# Patient Record
Sex: Female | Born: 1950
Health system: Southern US, Community
[De-identification: ages and names within clinical notes are randomized; demographics above are authoritative.]

## PROBLEM LIST (undated history)

## (undated) DIAGNOSIS — E785 Hyperlipidemia, unspecified: Secondary | ICD-10-CM

## (undated) DIAGNOSIS — K635 Polyp of colon: Secondary | ICD-10-CM

## (undated) DIAGNOSIS — E041 Nontoxic single thyroid nodule: Secondary | ICD-10-CM

## (undated) DIAGNOSIS — N852 Hypertrophy of uterus: Secondary | ICD-10-CM

## (undated) DIAGNOSIS — M8580 Other specified disorders of bone density and structure, unspecified site: Secondary | ICD-10-CM

## (undated) HISTORY — DX: Other specified disorders of bone density and structure, unspecified site: M85.80

## (undated) HISTORY — DX: Polyp of colon: K63.5

## (undated) HISTORY — DX: Hyperlipidemia, unspecified: E78.5

## (undated) HISTORY — DX: Nontoxic single thyroid nodule: E04.1

## (undated) HISTORY — PX: LAPAROSCOPY: SHX197

## (undated) HISTORY — DX: Hypertrophy of uterus: N85.2

---

## 1999-09-10 ENCOUNTER — Other Ambulatory Visit: Admission: RE | Admit: 1999-09-10 | Discharge: 1999-09-10 | Payer: Self-pay | Admitting: Obstetrics and Gynecology

## 2000-02-19 ENCOUNTER — Encounter: Payer: Self-pay | Admitting: Emergency Medicine

## 2000-02-19 ENCOUNTER — Emergency Department (HOSPITAL_COMMUNITY): Admission: EM | Admit: 2000-02-19 | Discharge: 2000-02-19 | Payer: Self-pay

## 2000-09-10 ENCOUNTER — Emergency Department (HOSPITAL_COMMUNITY): Admission: EM | Admit: 2000-09-10 | Discharge: 2000-09-10 | Payer: Self-pay | Admitting: Emergency Medicine

## 2000-09-10 ENCOUNTER — Encounter: Payer: Self-pay | Admitting: Emergency Medicine

## 2000-09-24 ENCOUNTER — Other Ambulatory Visit: Admission: RE | Admit: 2000-09-24 | Discharge: 2000-09-24 | Payer: Self-pay | Admitting: Family Medicine

## 2000-12-25 ENCOUNTER — Other Ambulatory Visit: Admission: RE | Admit: 2000-12-25 | Discharge: 2000-12-25 | Payer: Self-pay | Admitting: Gastroenterology

## 2000-12-25 ENCOUNTER — Encounter (INDEPENDENT_AMBULATORY_CARE_PROVIDER_SITE_OTHER): Payer: Self-pay | Admitting: Specialist

## 2002-01-05 ENCOUNTER — Other Ambulatory Visit: Admission: RE | Admit: 2002-01-05 | Discharge: 2002-01-05 | Payer: Self-pay | Admitting: Family Medicine

## 2002-09-01 ENCOUNTER — Encounter: Admission: RE | Admit: 2002-09-01 | Discharge: 2002-09-01 | Payer: Self-pay | Admitting: Family Medicine

## 2002-09-01 ENCOUNTER — Encounter: Payer: Self-pay | Admitting: Family Medicine

## 2002-12-28 ENCOUNTER — Other Ambulatory Visit: Admission: RE | Admit: 2002-12-28 | Discharge: 2002-12-28 | Payer: Self-pay | Admitting: Family Medicine

## 2002-12-29 ENCOUNTER — Encounter: Admission: RE | Admit: 2002-12-29 | Discharge: 2002-12-29 | Payer: Self-pay | Admitting: Family Medicine

## 2002-12-29 ENCOUNTER — Encounter: Payer: Self-pay | Admitting: Family Medicine

## 2002-12-30 ENCOUNTER — Encounter: Admission: RE | Admit: 2002-12-30 | Discharge: 2003-02-02 | Payer: Self-pay | Admitting: Family Medicine

## 2003-02-04 ENCOUNTER — Encounter: Admission: RE | Admit: 2003-02-04 | Discharge: 2003-03-09 | Payer: Self-pay | Admitting: Family Medicine

## 2003-12-28 ENCOUNTER — Other Ambulatory Visit: Admission: RE | Admit: 2003-12-28 | Discharge: 2003-12-28 | Payer: Self-pay | Admitting: Family Medicine

## 2005-01-21 ENCOUNTER — Ambulatory Visit: Payer: Self-pay | Admitting: Family Medicine

## 2005-01-21 ENCOUNTER — Other Ambulatory Visit: Admission: RE | Admit: 2005-01-21 | Discharge: 2005-01-21 | Payer: Self-pay | Admitting: Family Medicine

## 2005-02-21 ENCOUNTER — Ambulatory Visit: Payer: Self-pay | Admitting: Family Medicine

## 2005-06-17 HISTORY — PX: COLONOSCOPY: SHX174

## 2006-02-11 ENCOUNTER — Ambulatory Visit: Payer: Self-pay | Admitting: Gastroenterology

## 2006-02-24 ENCOUNTER — Ambulatory Visit: Payer: Self-pay | Admitting: Gastroenterology

## 2006-04-03 ENCOUNTER — Other Ambulatory Visit: Admission: RE | Admit: 2006-04-03 | Discharge: 2006-04-03 | Payer: Self-pay | Admitting: Family Medicine

## 2006-04-03 ENCOUNTER — Ambulatory Visit: Payer: Self-pay | Admitting: Family Medicine

## 2006-04-03 ENCOUNTER — Encounter: Payer: Self-pay | Admitting: Family Medicine

## 2006-04-11 ENCOUNTER — Ambulatory Visit: Payer: Self-pay | Admitting: Family Medicine

## 2006-04-11 LAB — CONVERTED CEMR LAB
AST: 19 units/L (ref 0–37)
BUN: 9 mg/dL (ref 6–23)
Chloride: 104 meq/L (ref 96–112)
Cholesterol: 203 mg/dL (ref 0–200)
Creatinine, Ser: 0.8 mg/dL (ref 0.4–1.2)
Eosinophil percent: 2.8 % (ref 0.0–5.0)
HCT: 40.6 % (ref 36.0–46.0)
HDL: 52.5 mg/dL (ref >39.0)
Hemoglobin: 13.5 g/dL (ref 12.0–15.0)
LDL DIRECT: 127.7 mg/dL
MCHC: 33.2 g/dL (ref 30.0–36.0)
MCV: 92.9 fL (ref 78.0–100.0)
Monocytes Absolute: 0.4 10*3/uL (ref 0.2–0.7)
Neutro Abs: 2.8 10*3/uL (ref 1.4–7.7)
Neutrophils Relative %: 61.1 % (ref 43.0–77.0)
RDW: 13.1 % (ref 11.5–14.6)

## 2006-05-13 ENCOUNTER — Ambulatory Visit: Payer: Self-pay | Admitting: Family Medicine

## 2006-12-17 ENCOUNTER — Ambulatory Visit: Payer: Self-pay | Admitting: Family Medicine

## 2006-12-17 DIAGNOSIS — M79609 Pain in unspecified limb: Secondary | ICD-10-CM

## 2006-12-22 ENCOUNTER — Encounter (INDEPENDENT_AMBULATORY_CARE_PROVIDER_SITE_OTHER): Payer: Self-pay | Admitting: *Deleted

## 2007-01-19 ENCOUNTER — Telehealth (INDEPENDENT_AMBULATORY_CARE_PROVIDER_SITE_OTHER): Payer: Self-pay | Admitting: *Deleted

## 2007-01-22 ENCOUNTER — Ambulatory Visit: Payer: Self-pay | Admitting: Family Medicine

## 2007-01-22 DIAGNOSIS — L299 Pruritus, unspecified: Secondary | ICD-10-CM | POA: Insufficient documentation

## 2007-01-23 LAB — CONVERTED CEMR LAB
ALT: 18 units/L (ref 0–35)
AST: 20 units/L (ref 0–37)
Albumin: 3.8 g/dL (ref 3.5–5.2)
Chloride: 106 meq/L (ref 96–112)
Creatinine, Ser: 0.7 mg/dL (ref 0.4–1.2)
Sodium: 139 meq/L (ref 135–145)
Total Bilirubin: 0.8 mg/dL (ref 0.3–1.2)

## 2007-02-17 ENCOUNTER — Ambulatory Visit: Payer: Self-pay | Admitting: Family Medicine

## 2007-02-17 DIAGNOSIS — R599 Enlarged lymph nodes, unspecified: Secondary | ICD-10-CM | POA: Insufficient documentation

## 2007-02-18 ENCOUNTER — Encounter: Admission: RE | Admit: 2007-02-18 | Discharge: 2007-02-18 | Payer: Self-pay | Admitting: Family Medicine

## 2007-02-18 LAB — CONVERTED CEMR LAB
Basophils Absolute: 0 10*3/uL (ref 0.0–0.1)
Eosinophils Absolute: 0.1 10*3/uL (ref 0.0–0.6)
Eosinophils Relative: 2.8 % (ref 0.0–5.0)
MCHC: 34.7 g/dL (ref 30.0–36.0)
MCV: 86.7 fL (ref 78.0–100.0)
Monocytes Relative: 2.4 % — ABNORMAL LOW (ref 3.0–11.0)
Platelets: 162 10*3/uL (ref 150–400)
RBC: 4.06 M/uL (ref 3.87–5.11)
WBC: 5 10*3/uL (ref 4.5–10.5)

## 2007-02-20 DIAGNOSIS — E041 Nontoxic single thyroid nodule: Secondary | ICD-10-CM | POA: Insufficient documentation

## 2007-02-23 ENCOUNTER — Ambulatory Visit: Payer: Self-pay | Admitting: Family Medicine

## 2007-02-25 ENCOUNTER — Encounter: Payer: Self-pay | Admitting: Family Medicine

## 2007-02-25 LAB — CONVERTED CEMR LAB
Free T4: 0.7 ng/dL (ref 0.6–1.6)
T3, Free: 2.7 pg/mL (ref 2.3–4.2)
TSH: 3.32 microintl units/mL (ref 0.35–5.50)

## 2007-03-30 ENCOUNTER — Encounter: Payer: Self-pay | Admitting: Family Medicine

## 2007-04-09 ENCOUNTER — Encounter (INDEPENDENT_AMBULATORY_CARE_PROVIDER_SITE_OTHER): Payer: Self-pay | Admitting: Interventional Radiology

## 2007-04-09 ENCOUNTER — Encounter: Payer: Self-pay | Admitting: Family Medicine

## 2007-04-09 ENCOUNTER — Other Ambulatory Visit: Admission: RE | Admit: 2007-04-09 | Discharge: 2007-04-09 | Payer: Self-pay | Admitting: Interventional Radiology

## 2007-04-09 ENCOUNTER — Encounter: Admission: RE | Admit: 2007-04-09 | Discharge: 2007-04-09 | Payer: Self-pay | Admitting: Otolaryngology

## 2007-04-17 ENCOUNTER — Telehealth: Payer: Self-pay | Admitting: Family Medicine

## 2007-04-17 DIAGNOSIS — N951 Menopausal and female climacteric states: Secondary | ICD-10-CM | POA: Insufficient documentation

## 2007-04-29 ENCOUNTER — Encounter: Payer: Self-pay | Admitting: Family Medicine

## 2007-05-07 ENCOUNTER — Other Ambulatory Visit: Admission: RE | Admit: 2007-05-07 | Discharge: 2007-05-07 | Payer: Self-pay | Admitting: Family Medicine

## 2007-05-07 ENCOUNTER — Telehealth (INDEPENDENT_AMBULATORY_CARE_PROVIDER_SITE_OTHER): Payer: Self-pay | Admitting: *Deleted

## 2007-05-07 ENCOUNTER — Ambulatory Visit: Payer: Self-pay | Admitting: Family Medicine

## 2007-05-07 ENCOUNTER — Encounter: Payer: Self-pay | Admitting: Family Medicine

## 2007-05-11 ENCOUNTER — Encounter: Payer: Self-pay | Admitting: Family Medicine

## 2007-05-13 ENCOUNTER — Encounter (INDEPENDENT_AMBULATORY_CARE_PROVIDER_SITE_OTHER): Payer: Self-pay | Admitting: *Deleted

## 2007-05-18 ENCOUNTER — Ambulatory Visit: Payer: Self-pay | Admitting: Family Medicine

## 2007-05-18 LAB — CONVERTED CEMR LAB
Nitrite: NEGATIVE
Specific Gravity, Urine: <1.005
Urobilinogen, UA: NEGATIVE
WBC Urine, dipstick: NEGATIVE

## 2007-05-21 DIAGNOSIS — M899 Disorder of bone, unspecified: Secondary | ICD-10-CM | POA: Insufficient documentation

## 2007-05-21 DIAGNOSIS — M949 Disorder of cartilage, unspecified: Secondary | ICD-10-CM

## 2007-05-25 LAB — CONVERTED CEMR LAB
ALT: 19 units/L (ref 0–35)
AST: 19 units/L (ref 0–37)
Basophils Relative: 0.5 % (ref 0.0–1.0)
Bilirubin, Direct: 0.1 mg/dL (ref 0.0–0.3)
CO2: 32 meq/L (ref 19–32)
Calcium: 9.4 mg/dL (ref 8.4–10.5)
Chloride: 108 meq/L (ref 96–112)
Direct LDL: 137.9 mg/dL
Eosinophils Absolute: 0.2 10*3/uL (ref 0.0–0.6)
Eosinophils Relative: 3.6 % (ref 0.0–5.0)
GFR calc non Af Amer: 93 mL/min
Glucose, Bld: 87 mg/dL (ref 70–99)
HCT: 37 % (ref 36.0–46.0)
Lymphocytes Relative: 33 % (ref 12.0–46.0)
MCV: 87 fL (ref 78.0–100.0)
Neutro Abs: 2.3 10*3/uL (ref 1.4–7.7)
Neutrophils Relative %: 56 % (ref 43.0–77.0)
RBC: 4.25 M/uL (ref 3.87–5.11)
Sodium: 144 meq/L (ref 135–145)
Total Protein: 6.7 g/dL (ref 6.0–8.3)
VLDL: 11 mg/dL (ref 0–40)
WBC: 4.2 10*3/uL — ABNORMAL LOW (ref 4.5–10.5)

## 2007-05-26 ENCOUNTER — Ambulatory Visit: Payer: Self-pay | Admitting: Family Medicine

## 2007-05-26 ENCOUNTER — Encounter (INDEPENDENT_AMBULATORY_CARE_PROVIDER_SITE_OTHER): Payer: Self-pay | Admitting: *Deleted

## 2007-08-31 ENCOUNTER — Ambulatory Visit: Payer: Self-pay | Admitting: Family Medicine

## 2007-09-10 LAB — CONVERTED CEMR LAB
ALT: 20 units/L (ref 0–35)
AST: 22 units/L (ref 0–37)
Albumin: 4 g/dL (ref 3.5–5.2)
Alkaline Phosphatase: 72 units/L (ref 39–117)
HDL: 70.1 mg/dL (ref >39.0)
Total Bilirubin: 0.8 mg/dL (ref 0.3–1.2)
VLDL: 15 mg/dL (ref 0–40)

## 2007-09-11 ENCOUNTER — Encounter (INDEPENDENT_AMBULATORY_CARE_PROVIDER_SITE_OTHER): Payer: Self-pay | Admitting: *Deleted

## 2008-01-15 ENCOUNTER — Telehealth (INDEPENDENT_AMBULATORY_CARE_PROVIDER_SITE_OTHER): Payer: Self-pay | Admitting: *Deleted

## 2008-01-15 ENCOUNTER — Ambulatory Visit: Payer: Self-pay | Admitting: Family Medicine

## 2008-01-15 DIAGNOSIS — M545 Low back pain, unspecified: Secondary | ICD-10-CM | POA: Insufficient documentation

## 2008-01-17 ENCOUNTER — Telehealth (INDEPENDENT_AMBULATORY_CARE_PROVIDER_SITE_OTHER): Payer: Self-pay | Admitting: *Deleted

## 2008-08-16 ENCOUNTER — Encounter: Payer: Self-pay | Admitting: Family Medicine

## 2008-08-16 ENCOUNTER — Other Ambulatory Visit: Admission: RE | Admit: 2008-08-16 | Discharge: 2008-08-16 | Payer: Self-pay | Admitting: Family Medicine

## 2008-08-16 ENCOUNTER — Ambulatory Visit: Payer: Self-pay | Admitting: Family Medicine

## 2008-08-16 DIAGNOSIS — D239 Other benign neoplasm of skin, unspecified: Secondary | ICD-10-CM | POA: Insufficient documentation

## 2008-08-17 ENCOUNTER — Encounter: Payer: Self-pay | Admitting: Family Medicine

## 2008-08-18 ENCOUNTER — Ambulatory Visit: Payer: Self-pay | Admitting: Family Medicine

## 2008-08-18 LAB — CONVERTED CEMR LAB
Blood in Urine, dipstick: NEGATIVE
Ketones, urine, test strip: NEGATIVE
Nitrite: NEGATIVE
Protein, U semiquant: NEGATIVE
Specific Gravity, Urine: <1.005
Urobilinogen, UA: 0.2
WBC Urine, dipstick: NEGATIVE

## 2008-08-22 ENCOUNTER — Encounter (INDEPENDENT_AMBULATORY_CARE_PROVIDER_SITE_OTHER): Payer: Self-pay | Admitting: *Deleted

## 2008-09-05 ENCOUNTER — Encounter (INDEPENDENT_AMBULATORY_CARE_PROVIDER_SITE_OTHER): Payer: Self-pay | Admitting: *Deleted

## 2008-09-05 ENCOUNTER — Ambulatory Visit: Payer: Self-pay | Admitting: Family Medicine

## 2008-09-07 ENCOUNTER — Encounter: Payer: Self-pay | Admitting: Family Medicine

## 2008-09-08 ENCOUNTER — Encounter (INDEPENDENT_AMBULATORY_CARE_PROVIDER_SITE_OTHER): Payer: Self-pay | Admitting: *Deleted

## 2008-09-08 LAB — CONVERTED CEMR LAB
ALT: 24 units/L (ref 0–35)
AST: 27 units/L (ref 0–37)
Albumin: 3.8 g/dL (ref 3.5–5.2)
Alkaline Phosphatase: 71 units/L (ref 39–117)
BUN: 13 mg/dL (ref 6–23)
Basophils Absolute: 0 10*3/uL (ref 0.0–0.1)
Basophils Relative: 0.5 % (ref 0.0–3.0)
Bilirubin, Direct: 0.1 mg/dL (ref 0.0–0.3)
CO2: 30 meq/L (ref 19–32)
Calcium: 9.6 mg/dL (ref 8.4–10.5)
Chloride: 106 meq/L (ref 96–112)
Cholesterol: 231 mg/dL (ref 0–200)
Creatinine, Ser: 0.7 mg/dL (ref 0.4–1.2)
Direct LDL: 158.1 mg/dL
Eosinophils Absolute: 0.1 10*3/uL (ref 0.0–0.7)
Eosinophils Relative: 3.2 % (ref 0.0–5.0)
GFR calc Af Amer: 111 mL/min
GFR calc non Af Amer: 92 mL/min
Glucose, Bld: 85 mg/dL (ref 70–99)
HCT: 40.3 % (ref 36.0–46.0)
HDL: 58.1 mg/dL (ref >39.0)
Hemoglobin: 13.9 g/dL (ref 12.0–15.0)
Lymphocytes Relative: 30 % (ref 12.0–46.0)
MCHC: 34.5 g/dL (ref 30.0–36.0)
MCV: 91.9 fL (ref 78.0–100.0)
Monocytes Absolute: 0.3 10*3/uL (ref 0.1–1.0)
Monocytes Relative: 7.4 % (ref 3.0–12.0)
Neutro Abs: 2.6 10*3/uL (ref 1.4–7.7)
Neutrophils Relative %: 58.9 % (ref 43.0–77.0)
Platelets: 139 10*3/uL — ABNORMAL LOW (ref 150–400)
Potassium: 4.8 meq/L (ref 3.5–5.1)
RBC: 4.39 M/uL (ref 3.87–5.11)
RDW: 12.7 % (ref 11.5–14.6)
Sodium: 142 meq/L (ref 135–145)
TSH: 3.63 microintl units/mL (ref 0.35–5.50)
Total Bilirubin: 0.9 mg/dL (ref 0.3–1.2)
Total CHOL/HDL Ratio: 4
Total Protein: 6.7 g/dL (ref 6.0–8.3)
Triglycerides: 81 mg/dL (ref 0–149)
VLDL: 16 mg/dL (ref 0–40)
WBC: 4.3 10*3/uL — ABNORMAL LOW (ref 4.5–10.5)

## 2008-09-14 ENCOUNTER — Telehealth (INDEPENDENT_AMBULATORY_CARE_PROVIDER_SITE_OTHER): Payer: Self-pay | Admitting: *Deleted

## 2008-09-19 ENCOUNTER — Encounter: Payer: Self-pay | Admitting: Family Medicine

## 2008-09-20 ENCOUNTER — Encounter: Payer: Self-pay | Admitting: Family Medicine

## 2008-10-14 ENCOUNTER — Telehealth (INDEPENDENT_AMBULATORY_CARE_PROVIDER_SITE_OTHER): Payer: Self-pay | Admitting: *Deleted

## 2008-10-28 IMAGING — US US SOFT TISSUE HEAD/NECK
1 series · 14 of 25 positions shown · non-contrast
Comparison: None.

CLINICAL DATA: Left sided lymph node enlargement and itching.     
 THYROID ULTRASOUND:
TECHNIQUE: Ultrasound examination of the thyroid gland and adjacent soft tissue structures was performed.

[Series 1: us soft tissue head/neck · 0.07mm/px · 14 of 78 slices shown]
[im 1/78]
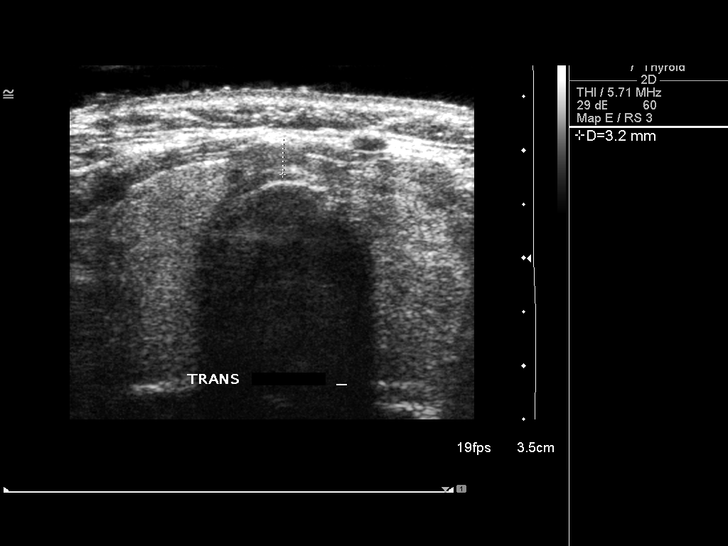
[im 7/78]
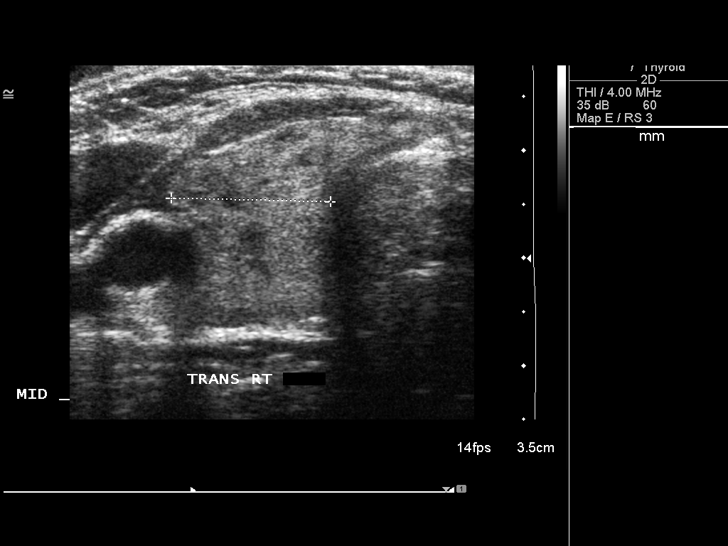
[im 13/78]
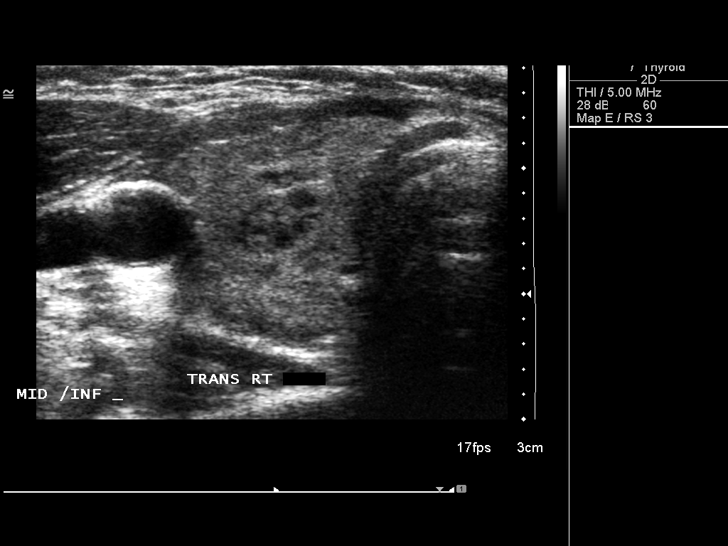
[im 20/78]
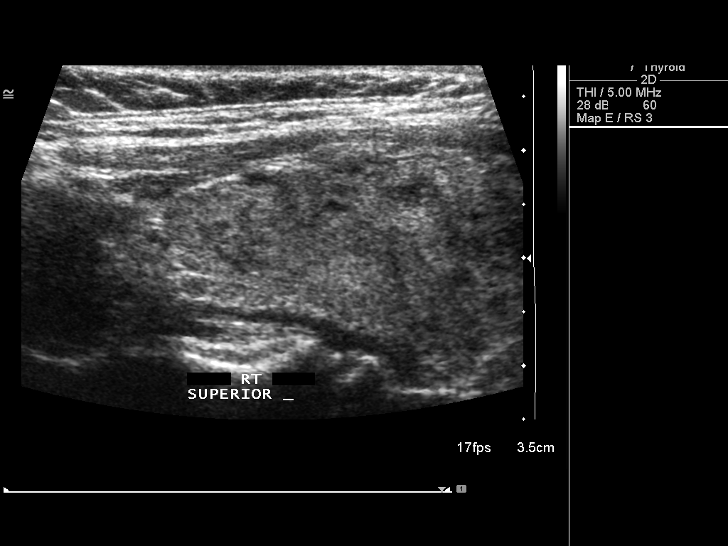
[im 26/78]
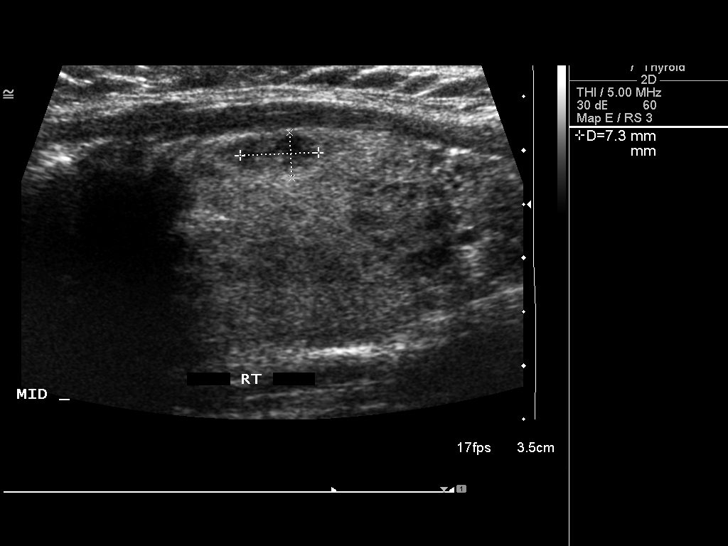
[im 29/78]
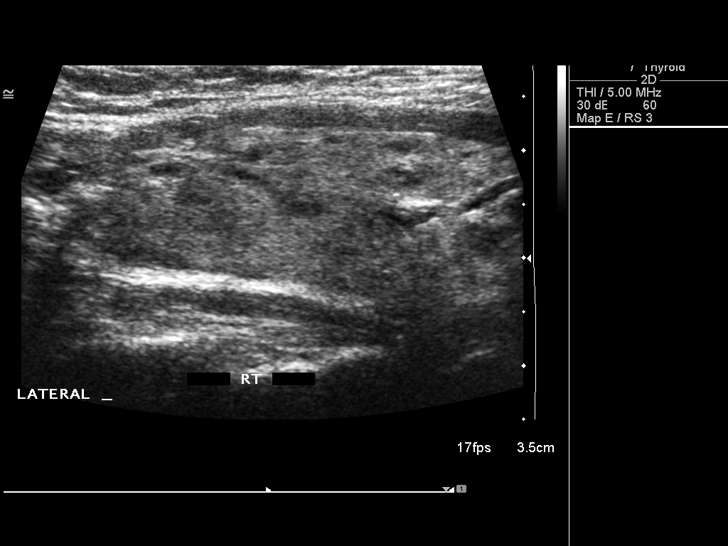
[im 36/78]
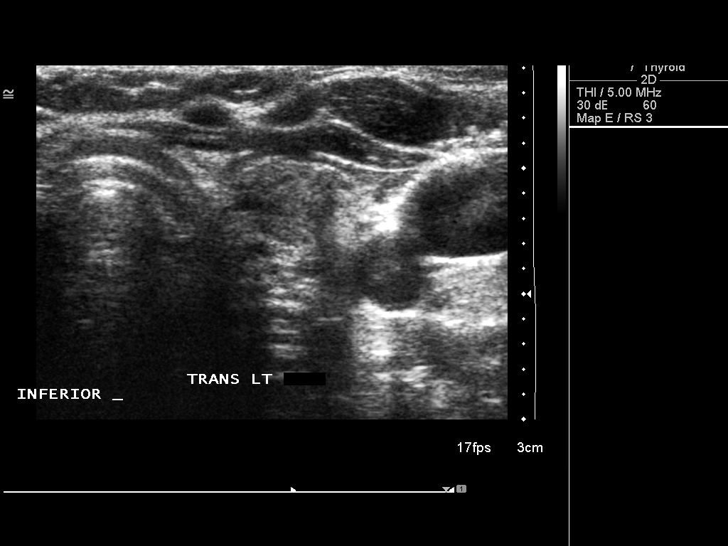
[im 42/78]
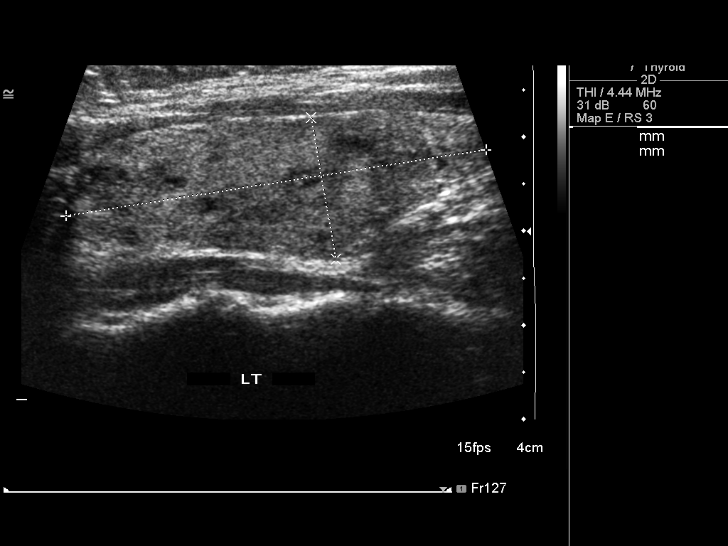
[im 49/78]
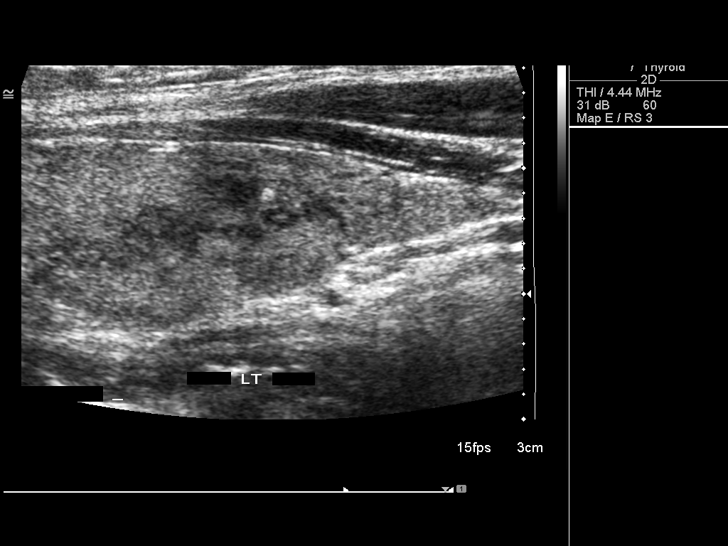
[im 52/78]
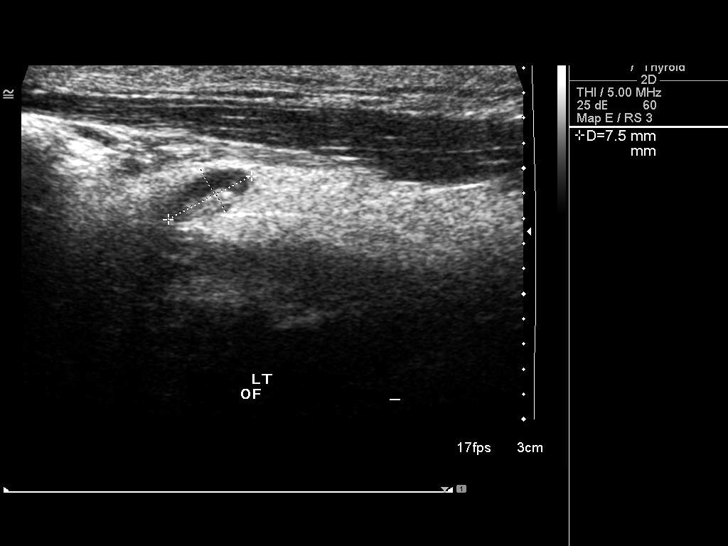
[im 58/78]
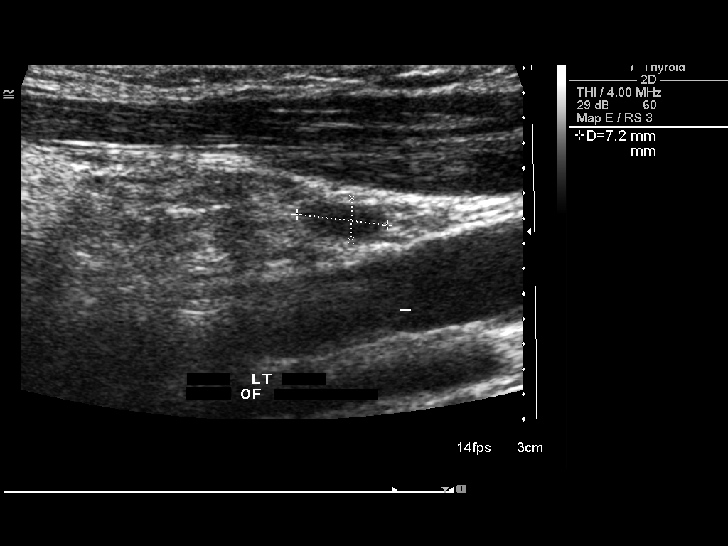
[im 65/78]
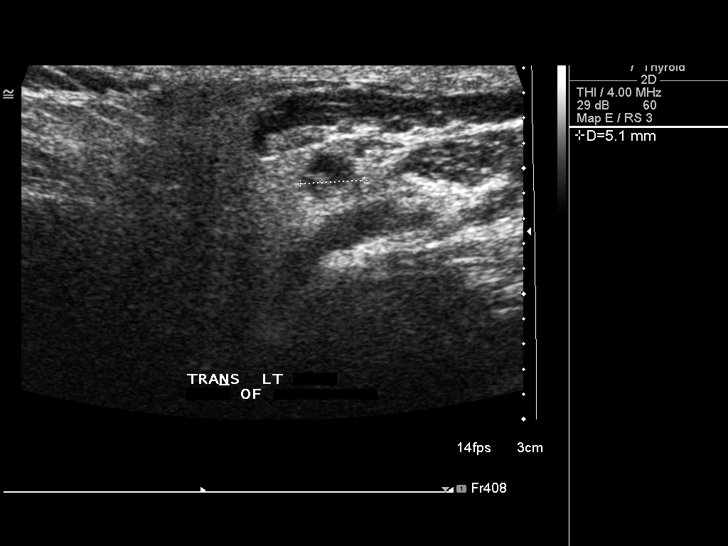
[im 71/78]
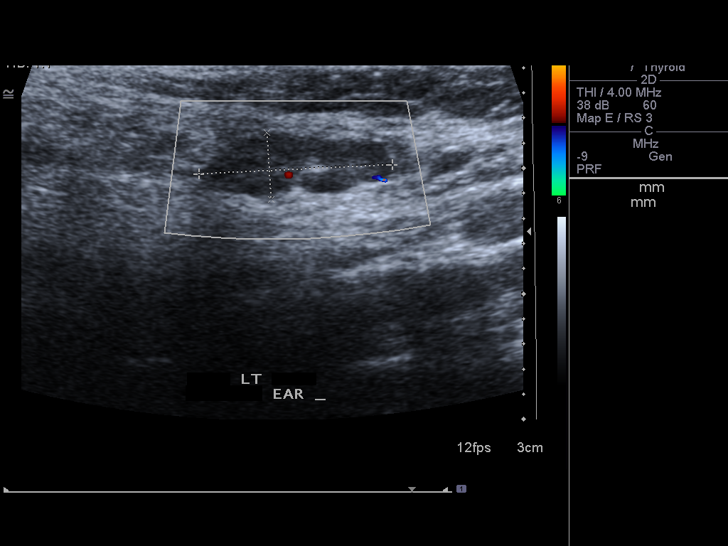
[im 78/78]
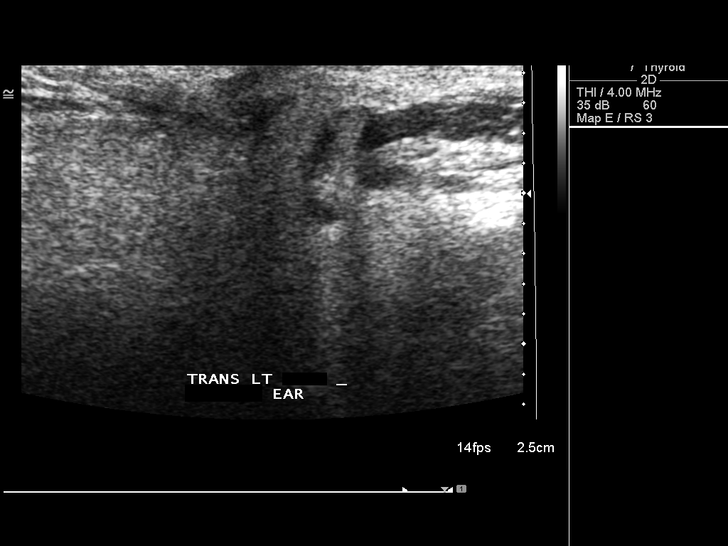

[14 of 25 positions shown; findings below may reference images not displayed]

FINDINGS: The right lobe of the thyroid measures 4.9 x 1.9 x 1.5 cm.  Left lobe measures 4.7 x 1.6 x 1.5 cm.  Isthmus measures 3 mm.  Thyroid echo texture is heterogeneous bilaterally.  There are small nodules in the thyroid bilaterally, with the largest measuring 10 x 8 x 9 mm in the lower pole on the right.  Lymph nodes are seen around the left sternocleidomastoid muscle with the largest measuring 15 x 5 x 6 mm.
IMPRESSION: 1.  Small thyroid nodules, as above.
 2.  Lymph nodes along the left sternocleidomastoid muscle.

## 2009-03-10 ENCOUNTER — Encounter: Payer: Self-pay | Admitting: Family Medicine

## 2009-04-11 ENCOUNTER — Encounter: Payer: Self-pay | Admitting: Family Medicine

## 2009-09-12 ENCOUNTER — Encounter: Payer: Self-pay | Admitting: Family Medicine

## 2009-09-13 ENCOUNTER — Encounter: Payer: Self-pay | Admitting: Family Medicine

## 2009-09-19 ENCOUNTER — Encounter: Payer: Self-pay | Admitting: Family Medicine

## 2009-09-24 IMAGING — CR DG SACRUM/COCCYX 2+V
3 series · 3 of 3 positions shown · non-contrast
Comparison: None available.

CLINICAL DATA: Low back and left buttock pain.

SACRUM AND COCCYX - 2+ VIEW

[view not recorded (1 of 3)]
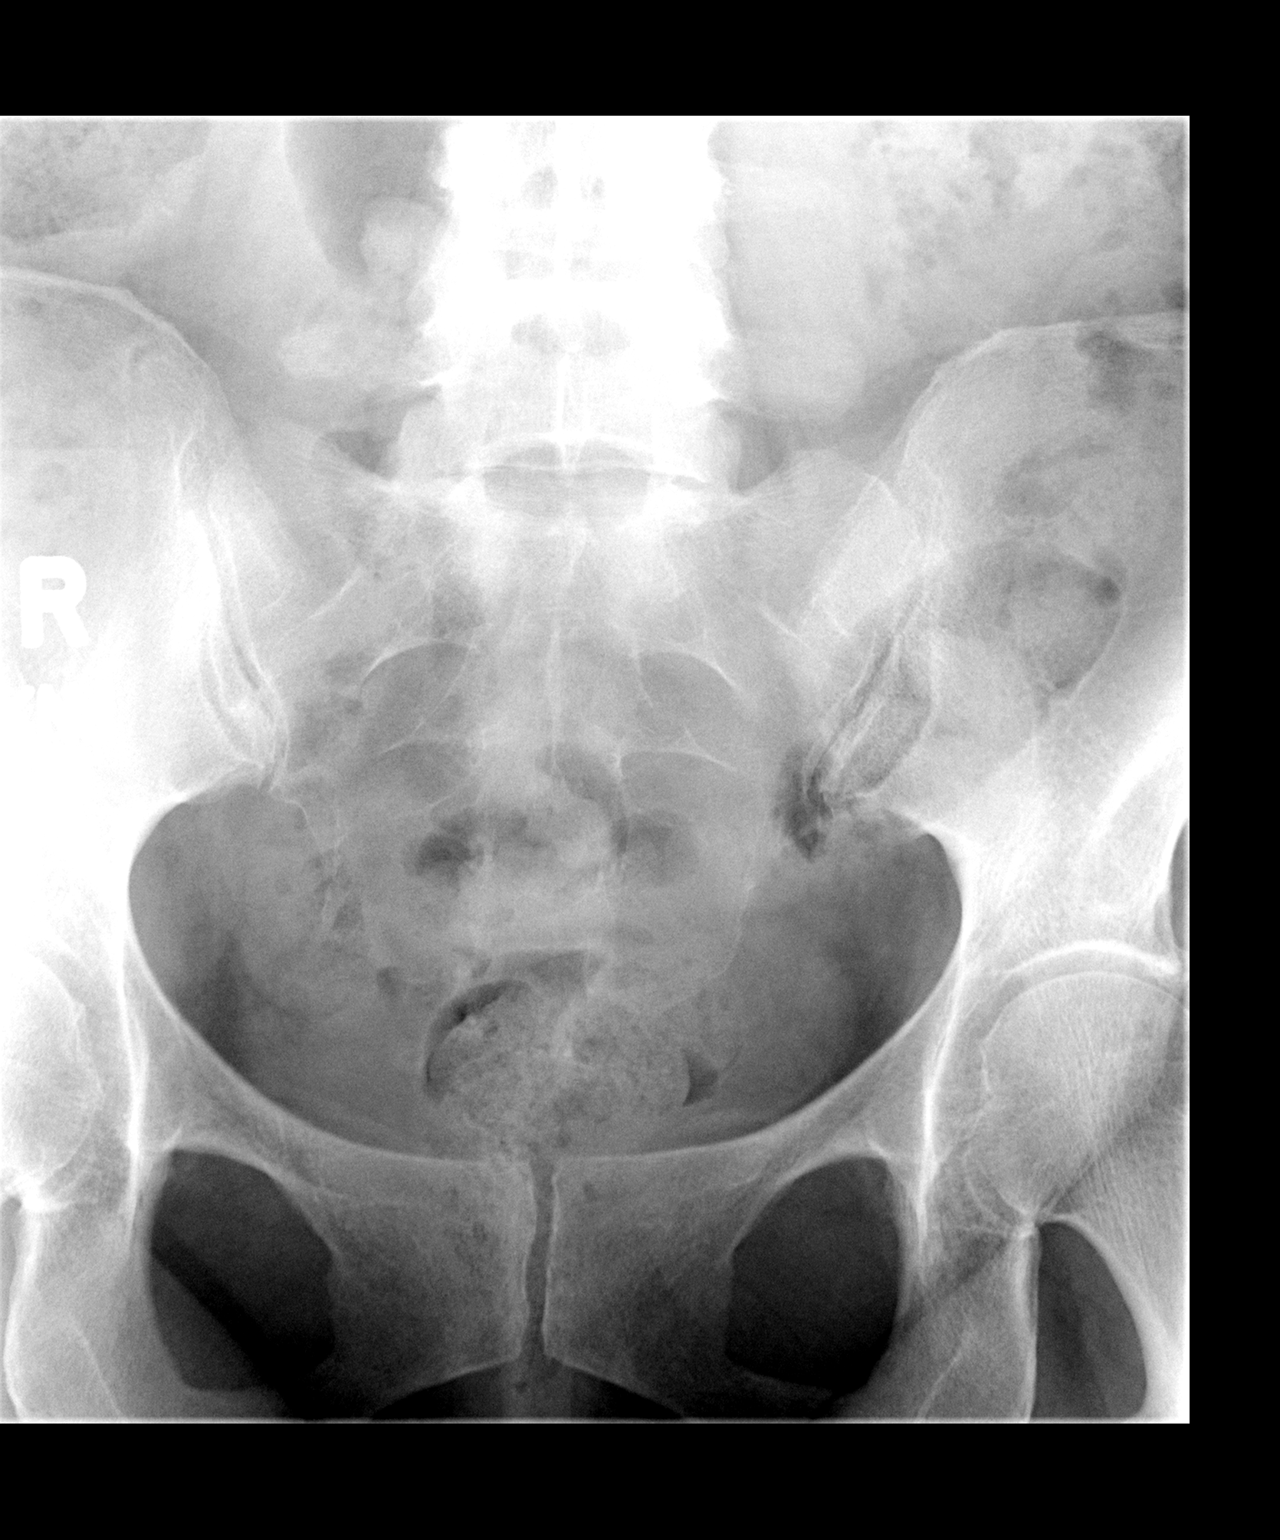

[view not recorded (2 of 3)]
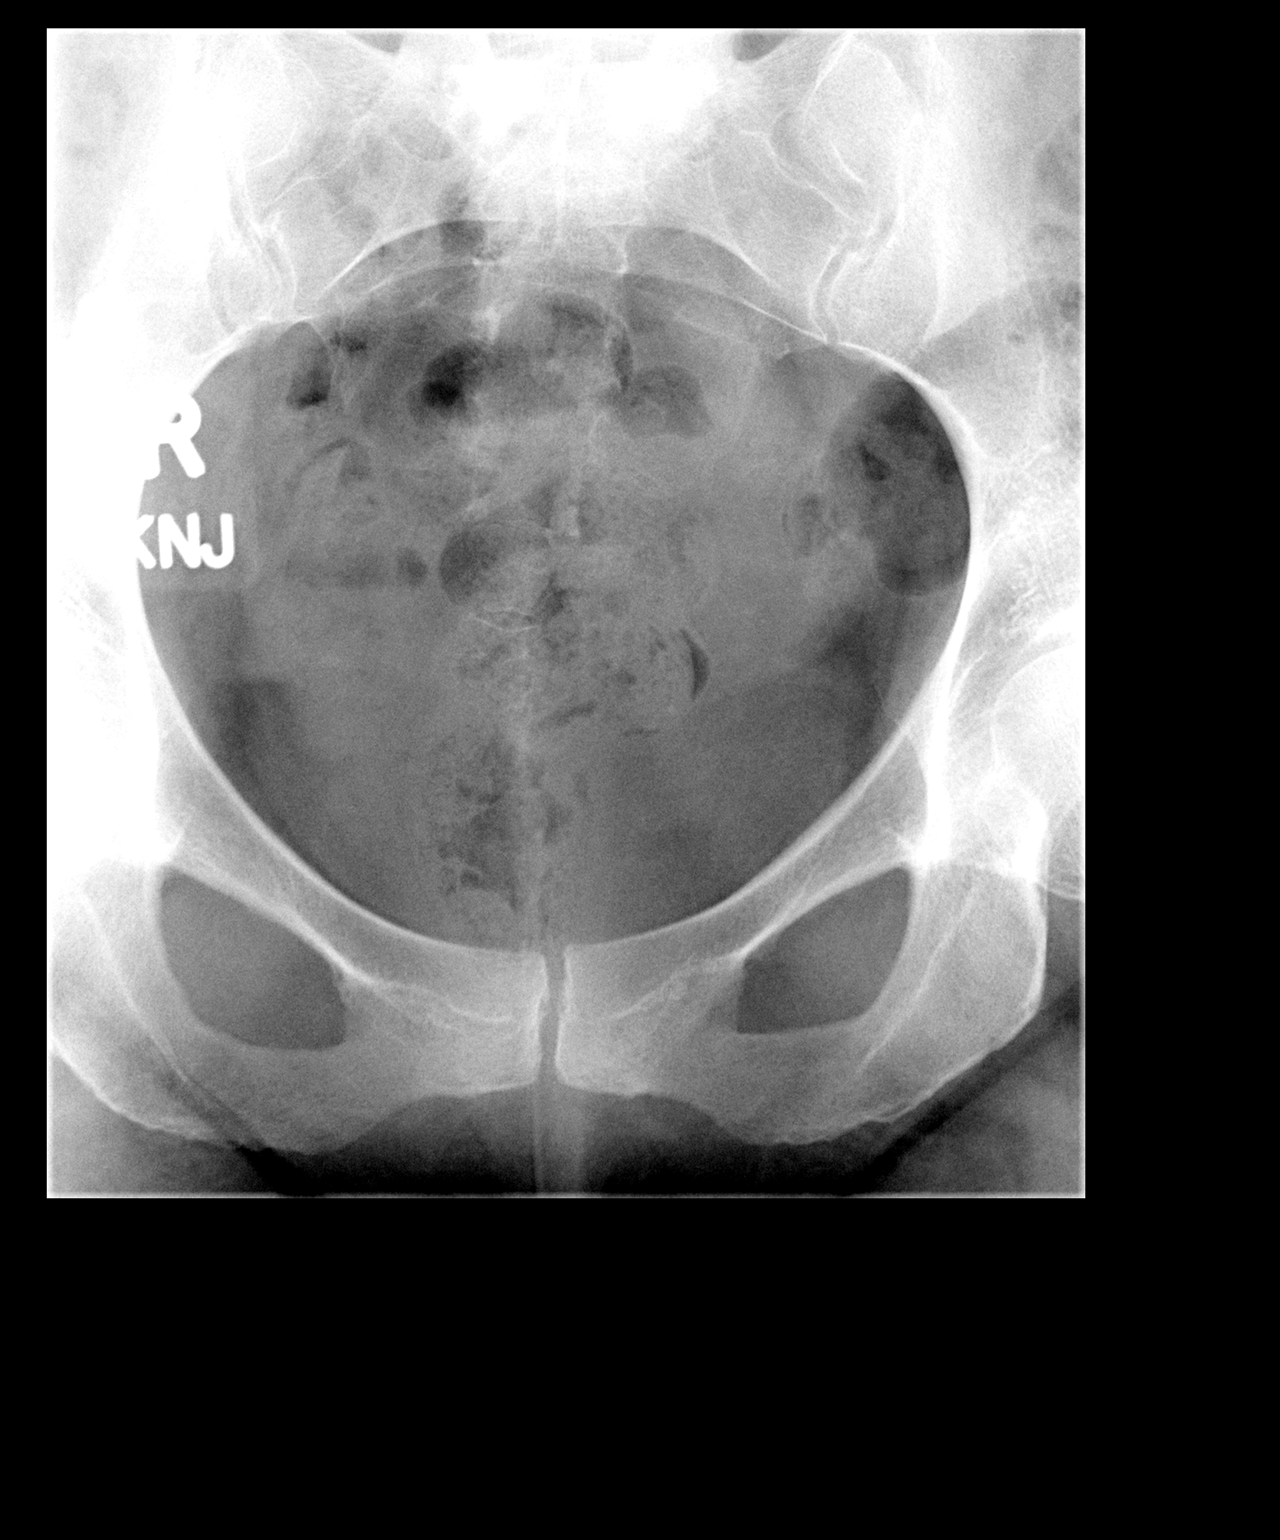

[view not recorded (3 of 3)]
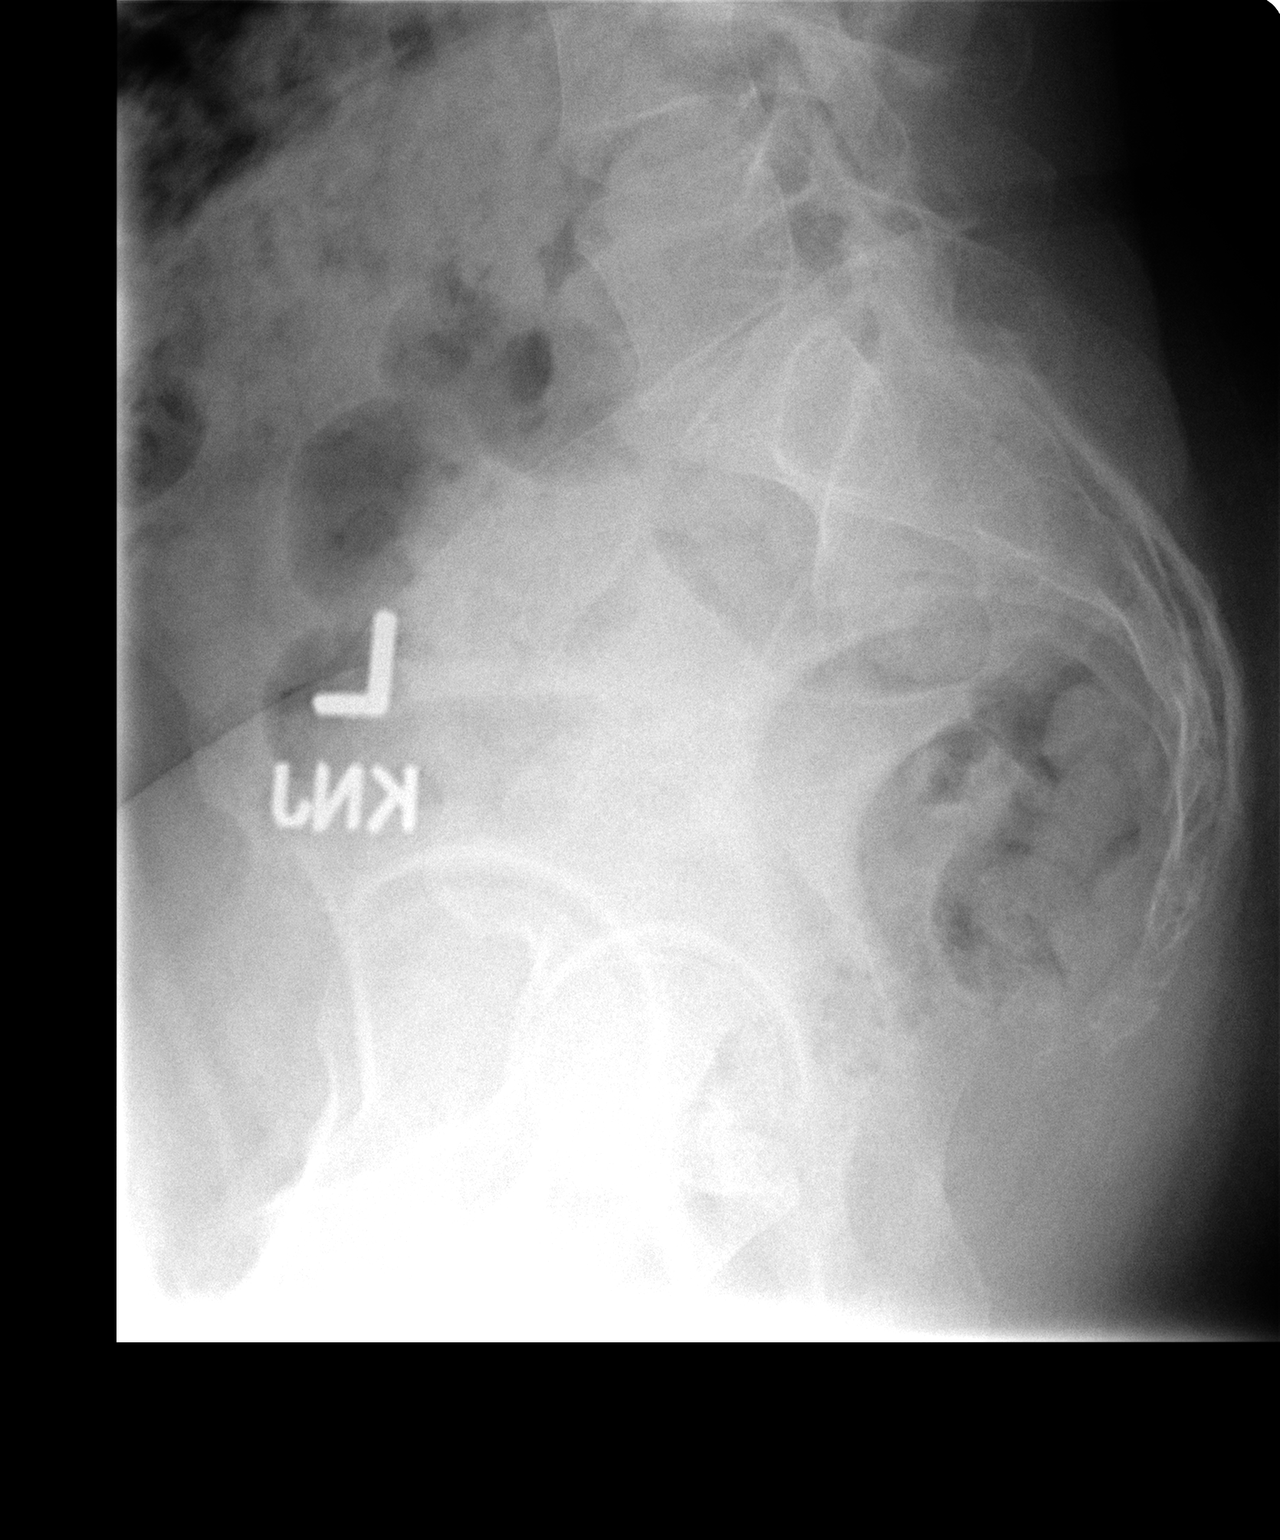

[3 of 3 positions shown; findings below may reference images not displayed]

FINDINGS: There is no fracture.  The sacroiliac joints are intact
with mild degenerative change noted.
IMPRESSION: No acute finding.

## 2009-10-03 ENCOUNTER — Telehealth (INDEPENDENT_AMBULATORY_CARE_PROVIDER_SITE_OTHER): Payer: Self-pay | Admitting: *Deleted

## 2009-10-16 ENCOUNTER — Telehealth (INDEPENDENT_AMBULATORY_CARE_PROVIDER_SITE_OTHER): Payer: Self-pay | Admitting: *Deleted

## 2010-07-15 LAB — CONVERTED CEMR LAB
OCCULT 2: NEGATIVE
OCCULT 3: NEGATIVE

## 2010-07-19 NOTE — Progress Notes (Signed)
Summary: rx-ACTONEL  Phone Note Call from Patient   Caller: Patient Summary of Call: pt called back stating that she wants to try actonel now and would like rx sent to pharmacy. pt aware rx sent to pharmacy..........Marland KitchenFelecia Deloach CMA  Oct 16, 2009 10:47 AM     New/Updated Medications: ACTONEL 150 MG TABS (RISEDRONATE SODIUM) TAKE ONE TABLET MONTHLY Prescriptions: ACTONEL 150 MG TABS (RISEDRONATE SODIUM) TAKE ONE TABLET MONTHLY  #1 x 11   Entered by:   Jeremy Johann CMA   Authorized by:   Loreen Freud DO   Signed by:   Jeremy Johann CMA on 10/16/2009   Method used:   Faxed to ...       [facility] [phone]* (retail)       [address]       [place], [place]  [address]       Ph: [phone]       Fax: [phone]   RxID:   [id number]

## 2010-07-19 NOTE — Progress Notes (Signed)
Summary: bone density results  Phone Note Outgoing Call   Call placed by: Army Fossa CMA,  October 03, 2009 4:08 PM Summary of Call: Regarding bone density results, LMTCB:   + osteopenic ---- pt never took actonel-- we can try by mouth med or reclast Recheck 2 years Signed by Loreen Freud DO on 10/03/2009 at 11:19 AM   Follow-up for Phone Call        [facility]. Army Fossa CMA  October 04, 2009 1:37 PM   Additional Follow-up for Phone Call Additional follow up Details #1::        Pt states that she is going to study on these opitions and call us back she would like a copy mailed to her. Army Fossa CMA  October 04, 2009 3:57 PM     Additional Follow-up for Phone Call Additional follow up Details #2::    patient returned. please call patient back Follow-up by: Barb Merino,  October 04, 2009 2:46 PM

## 2010-10-22 ENCOUNTER — Encounter: Payer: Self-pay | Admitting: Family Medicine

## 2010-10-23 ENCOUNTER — Ambulatory Visit (INDEPENDENT_AMBULATORY_CARE_PROVIDER_SITE_OTHER): Payer: PRIVATE HEALTH INSURANCE | Admitting: Family Medicine

## 2010-10-23 ENCOUNTER — Other Ambulatory Visit (HOSPITAL_COMMUNITY)
Admission: RE | Admit: 2010-10-23 | Discharge: 2010-10-23 | Disposition: A | Discharge status: Home / Self Care | Payer: PRIVATE HEALTH INSURANCE | Source: Ambulatory Visit | Attending: Family Medicine | Admitting: Family Medicine

## 2010-10-23 ENCOUNTER — Encounter: Payer: Self-pay | Admitting: Family Medicine

## 2010-10-23 VITALS — BP 124/64 | HR 60 | Ht 64.5 in | Wt 136.2 lb

## 2010-10-23 DIAGNOSIS — M899 Disorder of bone, unspecified: Secondary | ICD-10-CM

## 2010-10-23 DIAGNOSIS — Z0000 Encounter for general adult medical examination without abnormal findings: Secondary | ICD-10-CM

## 2010-10-23 DIAGNOSIS — Z01419 Encounter for gynecological examination (general) (routine) without abnormal findings: Secondary | ICD-10-CM | POA: Insufficient documentation

## 2010-10-23 DIAGNOSIS — M949 Disorder of cartilage, unspecified: Secondary | ICD-10-CM

## 2010-10-23 DIAGNOSIS — E785 Hyperlipidemia, unspecified: Secondary | ICD-10-CM | POA: Insufficient documentation

## 2010-10-23 DIAGNOSIS — M810 Age-related osteoporosis without current pathological fracture: Secondary | ICD-10-CM

## 2010-10-23 DIAGNOSIS — Z136 Encounter for screening for cardiovascular disorders: Secondary | ICD-10-CM | POA: Insufficient documentation

## 2010-10-23 LAB — POCT URINALYSIS DIPSTICK
Bilirubin, UA: NEGATIVE
Blood, UA: NEGATIVE
Glucose, UA: NEGATIVE
Ketones, UA: NEGATIVE
Leukocytes, UA: NEGATIVE
Nitrite, UA: NEGATIVE
pH, UA: 6.5

## 2010-10-23 LAB — CBC WITH DIFFERENTIAL/PLATELET
Basophils Relative: 0.4 % (ref 0.0–3.0)
Eosinophils Relative: 2.3 % (ref 0.0–5.0)
Monocytes Relative: 5.9 % (ref 3.0–12.0)
Neutrophils Relative %: 53 % (ref 43.0–77.0)
Platelets: 159 10*3/uL (ref 150.0–400.0)
RBC: 4.42 Mil/uL (ref 3.87–5.11)
WBC: 4 10*3/uL — ABNORMAL LOW (ref 4.5–10.5)

## 2010-10-23 LAB — BASIC METABOLIC PANEL
BUN: 12 mg/dL (ref 6–23)
Chloride: 106 mEq/L (ref 96–112)
GFR: 113.35 mL/min (ref >60.00)
Glucose, Bld: 83 mg/dL (ref 70–99)
Potassium: 4.5 mEq/L (ref 3.5–5.1)
Sodium: 144 mEq/L (ref 135–145)

## 2010-10-23 LAB — HEPATIC FUNCTION PANEL
ALT: 24 U/L (ref 0–35)
Alkaline Phosphatase: 68 U/L (ref 39–117)
Bilirubin, Direct: 0.1 mg/dL (ref 0.0–0.3)
Total Bilirubin: 1.1 mg/dL (ref 0.3–1.2)
Total Protein: 7 g/dL (ref 6.0–8.3)

## 2010-10-23 LAB — LIPID PANEL
HDL: 59.7 mg/dL (ref >39.00)
Total CHOL/HDL Ratio: 4
Triglycerides: 74 mg/dL (ref 0.0–149.0)

## 2010-10-23 MED ORDER — RISEDRONATE SODIUM 150 MG PO TABS: 150.0000 mg | ORAL_TABLET | ORAL | Status: DC

## 2010-10-23 NOTE — Assessment & Plan Note (Signed)
ghm utd Check fasting lab sbe Mammogram to be done

## 2010-10-23 NOTE — Assessment & Plan Note (Signed)
Calcium and vita d

## 2010-10-23 NOTE — Assessment & Plan Note (Signed)
Check labs Pt stopped meds on her own

## 2010-10-23 NOTE — Progress Notes (Signed)
  Subjective:     Paige Waters is a 60 y.o. female and is here for a comprehensive physical exam. The patient reports no problems.  History   Social History  . Marital Status: Married    Spouse Name: N/A    Number of Children: N/A  . Years of Education: N/A   Occupational History  . Not on file.   Social History Main Topics  . Smoking status: Never Smoker   . Smokeless tobacco: Never Used  . Alcohol Use: Yes  . Drug Use: No  . Sexually Active: Not on file   Other Topics Concern  . Not on file   Social History Narrative  . No narrative on file   Health Maintenance  Topic Date Due  . Pap Smear  06/16/1970  . Mammogram  04/27/2009  . Influenza Vaccine  03/18/2011  . Colonoscopy  02/24/2017  . Tetanus/tdap  05/06/2017    The following portions of the patient's history were reviewed and updated as appropriate: allergies, current medications, past family history, past medical history, past social history, past surgical history and problem list.  Review of Systems  Review of Systems  Constitutional: Negative for activity change, appetite change and fatigue.  HENT: Negative for hearing loss, congestion, tinnitus and ear discharge.  Dentist q16m Eyes: Negative for visual disturbance (see optho q1y -- vision corrected to 20/20 with glasses).  Respiratory: Negative for cough, chest tightness and shortness of breath.   Cardiovascular: Negative for chest pain, palpitations and leg swelling.  Gastrointestinal: Negative for abdominal pain, diarrhea, constipation and abdominal distention.  Genitourinary: Negative for urgency, frequency, decreased urine volume and difficulty urinating.  Musculoskeletal: Negative for back pain, arthralgias and gait problem.  Skin: Negative for color change, pallor and rash.  Neurological: Negative for dizziness, light-headedness, numbness and headaches.  Hematological: Negative for adenopathy. Does not bruise/bleed easily.  Psychiatric/Behavioral:  Negative for suicidal ideas, confusion, sleep disturbance, self-injury, dysphoric mood, decreased concentration and agitation.     Objective:    BP 124/64  Pulse 60  Ht 5' 4.5" (1.638 m)  Wt 136 lb 3.2 oz (61.78 kg)  BMI 23.02 kg/m2 General appearance: alert, cooperative, appears stated age and mild distress Head: Normocephalic, without obvious abnormality, atraumatic Eyes: conjunctivae/corneas clear. PERRL, EOM's intact. Fundi benign. Ears: normal TM's and external ear canals both ears Nose: Nares normal. Septum midline. Mucosa normal. No drainage or sinus tenderness. Throat: lips, mucosa, and tongue normal; teeth and gums normal Neck: no adenopathy, no carotid bruit, no JVD, supple, symmetrical, trachea midline and thyroid not enlarged, symmetric, no tenderness/mass/nodules Lungs: clear to auscultation bilaterally Breasts: normal appearance, no masses or tenderness Heart: regular rate and rhythm, S1, S2 normal, no murmur, click, rub or gallop Abdomen: soft, non-tender; bowel sounds normal; no masses,  no organomegaly Pelvic: cervix normal in appearance, external genitalia normal, no adnexal masses or tenderness, no cervical motion tenderness, rectovaginal septum normal, uterus normal size, shape, and consistency and vagina normal without discharge Extremities: extremities normal, atraumatic, no cyanosis or edema Pulses: 2+ and symmetric Skin: Skin color, texture, turgor normal. No rashes or lesions Lymph nodes: Cervical, supraclavicular, and axillary nodes normal. Neurologic: Grossly normal    Assessment:    Healthy female exam.    Plan:     See After Visit Summary for Counseling Recommendations

## 2010-10-25 LAB — VITAMIN D 1,25 DIHYDROXY
Vitamin D 1, 25 (OH)2 Total: 70 pg/mL (ref 18–72)
Vitamin D2 1, 25 (OH)2: <8 pg/mL
Vitamin D3 1, 25 (OH)2: 70 pg/mL

## 2010-11-02 NOTE — Consult Note (Signed)
[facility]  Patient:    Paige Waters, Paige Waters                          MRN: [id number] Proc. Date: 02/19/00 Adm. Date:  [id number] Attending:  Lorre Nick                          Consultation Report  REASON FOR CONSULTATION:  Paige Waters is a 60 year old female who presents to the ER with chest pain.  We are asked to see her for further evaluation of this chest pain.  HISTORY OF PRESENT ILLNESS:  Maybel has been in relatively good health.  She has been under considerable stress over the past several months because of the death of her 21 year old husband following a myocardial infarction.  She walks three miles a day on a regular basis.  Last night around 9 p.m., she started having some episodes of chest discomfort.  The chest pain was not associated with nausea, vomiting, or diaphoresis.  She tried to change position to get comfortable.  Eventually she got fairly comfortable on the couch but still was not able to sleep.  This morning the chest pain had persisted, and she came into the emergency room around 10 oclock for further evaluation.  Since that time, the chest pain has largely resolved.  She has not had any significant shortness of breath, diaphoresis, palpitations, syncope, or presyncope.  MEDICATIONS:  None.  ALLERGIES:  No known drug allergies.  PAST MEDICAL HISTORY:  None.  SOCIAL HISTORY:  The patient is a nonsmoker.  She drinks alcohol socially. She walks three miles on a regular basis.  FAMILY HISTORY:  Noncontributory.  REVIEW OF SYSTEMS:  Review of systems was reviewed and was essentially negative.  PHYSICAL EXAMINATION:  GENERAL:  She is a young white female in no acute distress.  VITAL SIGNS:  Her heart rate is 60, her blood pressure is 109/64.  HEENT:  2+ carotids.  There were no carotid bruits.  There is no thyromegaly and no JVD.  LUNGS:  Clear to auscultation.  HEART:  Regular rate, S1, S2.  She has a 1+ systolic  ejection murmur at the left sternal border.  ABDOMEN:  Good bowel sounds and is nontender.  She has no hepatosplenomegaly. There are no masses or bruits.  EXTREMITIES:  No clubbing, cyanosis, or edema.  Her calves are nontender.  Her distal pulses are intact.  NEUROLOGIC:  Cranial nerves II-XII are intact.  Her motor and sensory function are intact.  Her gait was not assessed.  LABORATORY DATA:  Her EKG was normal at 10 oclock, and it is normal currently at 4:30.  Her enzymes are negative x 1.  IMPRESSION:  Paige Waters is a young female who presents with some atypical chest pains.  She is currently approximately 18 hours out from the onset of her chest pain.  Using the inpatient rule-out MI criteria, she is essentially ruled out at this point.  Will get one further set of cardiac enzymes right now.  I have her home phone number and will call her if the enzymes are positive.  Her EKG remains stable, and she remains pain-free.  She is at low risk for any cardiac event.  We will discharge her and have her come to the office for a stress Cardiolite study within the week.  I have given her my card and have asked her to call  me if she has further episodes of pain. DD:  02/19/00 TD:  02/20/00 Job: [id number]

## 2011-11-16 ENCOUNTER — Other Ambulatory Visit: Payer: Self-pay | Admitting: Family Medicine

## 2011-11-18 ENCOUNTER — Other Ambulatory Visit: Payer: Self-pay | Admitting: *Deleted

## 2011-11-18 MED ORDER — RISEDRONATE SODIUM 150 MG PO TABS: ORAL_TABLET | ORAL | Status: DC

## 2011-11-28 ENCOUNTER — Ambulatory Visit (INDEPENDENT_AMBULATORY_CARE_PROVIDER_SITE_OTHER): Payer: Self-pay | Admitting: Family Medicine

## 2011-11-28 ENCOUNTER — Encounter: Payer: Self-pay | Admitting: Family Medicine

## 2011-11-28 VITALS — BP 116/72 | HR 81 | Temp 98.6°F | Wt 141.2 lb

## 2011-11-28 DIAGNOSIS — M25519 Pain in unspecified shoulder: Secondary | ICD-10-CM

## 2011-11-28 DIAGNOSIS — M25511 Pain in right shoulder: Secondary | ICD-10-CM

## 2011-11-28 DIAGNOSIS — M810 Age-related osteoporosis without current pathological fracture: Secondary | ICD-10-CM

## 2011-11-28 DIAGNOSIS — Z1239 Encounter for other screening for malignant neoplasm of breast: Secondary | ICD-10-CM

## 2011-11-28 NOTE — Progress Notes (Signed)
   Subjective:    Paige Waters is a 61 y.o. female who presents with right shoulder pain. The symptoms began several months ago. Aggravating factors: injury while exercising and doing front raises with weights. Pain is located ant part upper arm . Discomfort is described as sharp/stabbing. Symptoms are exacerbated by lying on the shoulder and internal rotation. Evaluation to date: none. Therapy to date includes: rest, avoidance of offending activity and OTC analgesics which are not very effective.  The following portions of the patient's history were reviewed and updated as appropriate: allergies, current medications, past family history, past medical history, past social history, past surgical history and problem list.  Review of Systems Pertinent items are noted in HPI.   Objective:    BP 116/72  Pulse 81  Temp 98.6 F (37 C) (Oral)  Wt 141 lb 3.2 oz (64.048 kg)  SpO2 97% Right shoulder: tenderness over the glenohumeral joint, full ROM and no crepitus  Left shoulder: normal active ROM, no tenderness, no impingement sign     Assessment:    Right shoulder strain    Plan:    Agricultural engineer distributed. Reduction in offending activity. Rest, ice, compression, and elevation (RICE) therapy. NSAIDs per medication orders.  Refer to sports med

## 2011-11-28 NOTE — Patient Instructions (Addendum)
Shoulder Pain The shoulder is a ball and socket joint. The muscles and tendons (rotator cuff) are what keep the shoulder in its joint and stable. This collection of muscles and tendons holds in the head (ball) of the humerus (upper arm bone) in the fossa (cup) of the scapula (shoulder blade). Today no reason was found for your shoulder pain. Often pain in the shoulder may be treated conservatively with temporary immobilization. For example, holding the shoulder in one place using a sling for rest. Physical therapy may be needed if problems continue. HOME CARE INSTRUCTIONS   Apply ice to the sore area for 15 to 20 minutes, 3 to 4 times per day for the first 2 days. Put the ice in a plastic bag. Place a towel between the bag of ice and your skin.   If you have or were given a shoulder sling and straps, do not remove for as long as directed by your caregiver or until you see a caregiver for a follow-up examination. If you need to remove it to shower or bathe, move your arm as little as possible.   Sleep on several pillows at night to lessen swelling and pain.   Only take over-the-counter or prescription medicines for pain, discomfort, or fever as directed by your caregiver.   Keep any follow-up appointments in order to avoid any type of permanent shoulder disability or chronic pain problems.  SEEK MEDICAL CARE IF:   Pain in your shoulder increases or new pain develops in your arm, hand, or fingers.   Your hand or fingers are colder than your other hand.   You do not obtain pain relief with the medications or your pain becomes worse.  SEEK IMMEDIATE MEDICAL CARE IF:   Your arm, hand, or fingers are numb or tingling.   Your arm, hand, or fingers are swollen, painful, or turn white or blue.   You develop chest pain or shortness of breath.  MAKE SURE YOU:   Understand these instructions.   Will watch your condition.   Will get help right away if you are not doing well or get worse.    Document Released: 03/13/2005 Document Revised: 05/23/2011 Document Reviewed: 05/18/2011 [employer] Patient Information 2012 [employer]. 

## 2011-12-03 ENCOUNTER — Ambulatory Visit: Payer: 59 | Admitting: Family Medicine

## 2011-12-04 ENCOUNTER — Encounter: Payer: Self-pay | Admitting: Family Medicine

## 2011-12-04 ENCOUNTER — Ambulatory Visit (INDEPENDENT_AMBULATORY_CARE_PROVIDER_SITE_OTHER): Payer: 59 | Admitting: Family Medicine

## 2011-12-04 VITALS — BP 105/67 | HR 64 | Temp 97.6°F | Ht 65.0 in | Wt 135.0 lb

## 2011-12-04 DIAGNOSIS — M25519 Pain in unspecified shoulder: Secondary | ICD-10-CM

## 2011-12-04 DIAGNOSIS — M25511 Pain in right shoulder: Secondary | ICD-10-CM | POA: Insufficient documentation

## 2011-12-04 NOTE — Assessment & Plan Note (Signed)
2/2 rotator cuff strain.  Start with PT and transition to HEP.  Tylenol and/or aleve as needed for pain.  If not improving as expected over next 6 weeks will consider subacromial injection, nitro patches, and/or MRI.  Has excellent strength and no acute injury to suggest rotator cuff tear.

## 2011-12-04 NOTE — Patient Instructions (Addendum)
You strained your supraspinatus and infraspinatus (rotator cuff muscles). Try to avoid painful activities (overhead activities, lifting with extended arm) as much as possible except when doing exercises. Aleve and/or tylenol as needed for pain. Subacromial injection may be beneficial to help with pain and to decrease inflammation - consider only if not improving with rehab as expected. Start physical therapy with transition to home exercise program - do home exercises most days of the week. If not improving at follow-up we will consider further imaging, injection, and/or nitro patches. Follow up with me in 5-6 weeks.

## 2011-12-04 NOTE — Progress Notes (Signed)
Subjective:    Patient ID: Paige Waters, female    DOB: 27-Feb-1951, 61 y.o.   MRN: [id number]  PCP: Dr. Laury Axon  HPI 61 yo F here for right shoulder pain.  Patient denies known injury. States several months ago while doing shoulder flexion exercises with 5 pound weight started to develop right shoulder pain without acute twinge or injury. Is right handed. Since then has had pain with internal rotation motion, overhead motion, reaching behind back. Takes tylenol as needed. No other treatment to date. Hurts worse in morning if lies on right shoulder at night.  Past Medical History  Diagnosis Date  . Osteopenia   . Thyroid nodule   . Colon polyps   . Hyperlipidemia   . Uterine hyperplasia     Current Outpatient Prescriptions on File Prior to Visit  Medication Sig Dispense Refill  . Black Cohosh 40 MG CAPS Take 2 capsules by mouth daily.        . Calcium-Magnesium-Vitamin D 500-250-200 MG-MG-UNIT TABS Take 1 tablet by mouth daily.        . Flaxseed, Linseed, (FLAX SEED OIL) 1000 MG CAPS Take 1 capsule by mouth daily.      . Red Yeast Rice 600 MG CAPS Take by mouth.      . risedronate (ACTONEL) 150 MG tablet with water on empty stomach, nothing by mouth or lie down for next 30 minutes.TAKE 1 TABLET ONCE EVERY 30 DAYS ON EMPTY STOMACH NOTHING BY MOUTH ORLIE DOWN FOR 30 MINS AFTER  1 tablet  0    Past Surgical History  Procedure Date  . Laparoscopy     No Known Allergies  History   Social History  . Marital Status: Married    Spouse Name: N/A    Number of Children: N/A  . Years of Education: N/A   Occupational History  . Not on file.   Social History Main Topics  . Smoking status: Never Smoker   . Smokeless tobacco: Never Used  . Alcohol Use: Yes  . Drug Use: No  . Sexually Active: Not on file   Other Topics Concern  . Not on file   Social History Narrative  . No narrative on file    Family History  Problem Relation Age of Onset  . Coronary artery disease  Father 9  . Heart disease Father 31    MI---2nd one at 32  . Heart attack Father   . Hyperlipidemia Father   . Osteoporosis Mother   . Colon polyps Brother   . Kidney disease Brother     dialysis  . Hyperlipidemia Brother   . Diabetes Neg Hx   . Hypertension Neg Hx   . Sudden death Neg Hx     BP 105/67  Pulse 64  Temp 97.6 F (36.4 C) (Oral)  Ht 5\' 5"  (1.651 m)  Wt 135 lb (61.236 kg)  BMI 22.47 kg/m2  Review of Systems See HPI above.    Objective:   Physical Exam Gen: NAD  R shoulder: No swelling, ecchymoses.  No gross deformity. No TTP at [facility] joint, biceps tendon FROM with mild painful arc. Mild positive hawkins and neers. Negative Speeds, Yergasons. Strength 5/5 with empty can and resisted internal/external rotation.  Pain with empty can and resisted ER. Negative apprehension. NV intact distally.  L shoulder: FROM without pain or weakness.    Assessment & Plan:  1. Right shoulder pain - 2/2 rotator cuff strain.  Start with PT and transition to HEP.  Tylenol and/or aleve as needed for pain.  If not improving as expected over next 6 weeks will consider subacromial injection, nitro patches, and/or MRI.  Has excellent strength and no acute injury to suggest rotator cuff tear.

## 2012-01-13 ENCOUNTER — Encounter: Payer: Self-pay | Admitting: Family Medicine

## 2012-01-13 ENCOUNTER — Ambulatory Visit (INDEPENDENT_AMBULATORY_CARE_PROVIDER_SITE_OTHER): Payer: 59 | Admitting: Family Medicine

## 2012-01-13 VITALS — BP 119/82 | HR 69 | Temp 98.0°F | Ht 65.0 in | Wt 135.0 lb

## 2012-01-13 DIAGNOSIS — M25511 Pain in right shoulder: Secondary | ICD-10-CM

## 2012-01-13 DIAGNOSIS — M25519 Pain in unspecified shoulder: Secondary | ICD-10-CM

## 2012-01-13 NOTE — Assessment & Plan Note (Signed)
2/2 rotator cuff strain initially though now symptoms indicative of some residual biceps tendinopathy.  Excellent improvement to this point - will continue with PT and HEP.  Tylenol or aleve as needed.  Icing as needed.  If she does not continue to improve would consider subacromial injection, nitro patches, and/or MRI.

## 2012-01-13 NOTE — Progress Notes (Signed)
Subjective:    Patient ID: Paige Waters, female    DOB: [birth year], 61 y.o.   MRN: [id number]  PCP: Dr. Laury Axon  HPI  61 yo F here for f/u right shoulder pain.  6/19: Patient denies known injury. States several months ago while doing shoulder flexion exercises with 5 pound weight started to develop right shoulder pain without acute twinge or injury. Is right handed. Since then has had pain with internal rotation motion, overhead motion, reaching behind back. Takes tylenol as needed. No other treatment to date. Hurts worse in morning if lies on right shoulder at night.  7/29: Patient reports she is about 75% improved with PT and HEP. No longer taking any medications. Does not wake her up at night. Bothers only with certain movements (ER of shoulder for example). No numbness/tingling. No other complaints.  Past Medical History  Diagnosis Date  . Osteopenia   . Thyroid nodule   . Colon polyps   . Hyperlipidemia   . Uterine hyperplasia     Current Outpatient Prescriptions on File Prior to Visit  Medication Sig Dispense Refill  . Black Cohosh 40 MG CAPS Take 2 capsules by mouth daily.        . Calcium-Magnesium-Vitamin D 500-250-200 MG-MG-UNIT TABS Take 1 tablet by mouth daily.        . Flaxseed, Linseed, (FLAX SEED OIL) 1000 MG CAPS Take 1 capsule by mouth daily.      . Red Yeast Rice 600 MG CAPS Take by mouth.      . risedronate (ACTONEL) 150 MG tablet with water on empty stomach, nothing by mouth or lie down for next 30 minutes.TAKE 1 TABLET ONCE EVERY 30 DAYS ON EMPTY STOMACH NOTHING BY MOUTH ORLIE DOWN FOR 30 MINS AFTER  1 tablet  0    Past Surgical History  Procedure Date  . Laparoscopy     No Known Allergies  History   Social History  . Marital Status: Married    Spouse Name: N/A    Number of Children: N/A  . Years of Education: N/A   Occupational History  . Not on file.   Social History Main Topics  . Smoking status: Never Smoker   . Smokeless  tobacco: Never Used  . Alcohol Use: Yes  . Drug Use: No  . Sexually Active: Not on file   Other Topics Concern  . Not on file   Social History Narrative  . No narrative on file    Family History  Problem Relation Age of Onset  . Coronary artery disease Father 18  . Heart disease Father 56    MI---2nd one at 32  . Heart attack Father   . Hyperlipidemia Father   . Osteoporosis Mother   . Colon polyps Brother   . Kidney disease Brother     dialysis  . Hyperlipidemia Brother   . Diabetes Neg Hx   . Hypertension Neg Hx   . Sudden death Neg Hx     BP 119/82  Pulse 69  Temp 98 F (36.7 C) (Oral)  Ht 5\' 5"  (1.651 m)  Wt 135 lb (61.236 kg)  BMI 22.47 kg/m2  Review of Systems  See HPI above.    Objective:   Physical Exam  Gen: NAD  R shoulder: No swelling, ecchymoses.  No gross deformity. No TTP at [facility] joint.  Mild TTP at biceps tendon FROM with negative painful arc. Negative hawkins and neers. Negative Speeds, Yergasons. Strength 5/5 with empty can and  resisted internal/external rotation.  No longer having pain with empty can and resisted ER. Negative apprehension. NV intact distally.  L shoulder: FROM without pain or weakness.    Assessment & Plan:  1. Right shoulder pain - 2/2 rotator cuff strain initially though now symptoms indicative of some residual biceps tendinopathy.  Excellent improvement to this point - will continue with PT and HEP.  Tylenol or aleve as needed.  Icing as needed.  If she does not continue to improve would consider subacromial injection, nitro patches, and/or MRI.

## 2012-01-16 ENCOUNTER — Encounter: Payer: Self-pay | Admitting: Family Medicine

## 2012-01-23 ENCOUNTER — Encounter: Payer: Self-pay | Admitting: Family Medicine

## 2012-11-18 ENCOUNTER — Other Ambulatory Visit: Payer: Self-pay | Admitting: Family Medicine

## 2013-05-05 ENCOUNTER — Telehealth: Payer: Self-pay

## 2013-05-05 NOTE — Telephone Encounter (Signed)
Medication and allergies: reviewed and updated  90 day supply/mail order: na Local pharmacy: [facility] [address]   Immunizations due:  Unsure about flu vaccine?  A/P:   No changes to FH or PSH Tdap--06/2006 Pap--10/2010--neg MMG--12/2011 CCS--02/2007--due 2018  To Discuss with Provider: Shingles vaccine  Cost of actonel--alternative?

## 2013-05-06 ENCOUNTER — Ambulatory Visit (INDEPENDENT_AMBULATORY_CARE_PROVIDER_SITE_OTHER): Payer: 59 | Admitting: Family Medicine

## 2013-05-06 ENCOUNTER — Encounter: Payer: Self-pay | Admitting: Family Medicine

## 2013-05-06 VITALS — BP 106/64 | HR 68 | Temp 98.6°F | Ht 64.5 in | Wt 146.0 lb

## 2013-05-06 DIAGNOSIS — Z2911 Encounter for prophylactic immunotherapy for respiratory syncytial virus (RSV): Secondary | ICD-10-CM

## 2013-05-06 DIAGNOSIS — Z01419 Encounter for gynecological examination (general) (routine) without abnormal findings: Secondary | ICD-10-CM | POA: Insufficient documentation

## 2013-05-06 DIAGNOSIS — Z124 Encounter for screening for malignant neoplasm of cervix: Secondary | ICD-10-CM

## 2013-05-06 DIAGNOSIS — M899 Disorder of bone, unspecified: Secondary | ICD-10-CM

## 2013-05-06 DIAGNOSIS — E785 Hyperlipidemia, unspecified: Secondary | ICD-10-CM

## 2013-05-06 DIAGNOSIS — Z23 Encounter for immunization: Secondary | ICD-10-CM

## 2013-05-06 DIAGNOSIS — Z1151 Encounter for screening for human papillomavirus (HPV): Secondary | ICD-10-CM | POA: Insufficient documentation

## 2013-05-06 DIAGNOSIS — M8580 Other specified disorders of bone density and structure, unspecified site: Secondary | ICD-10-CM

## 2013-05-06 DIAGNOSIS — Z0000 Encounter for general adult medical examination without abnormal findings: Secondary | ICD-10-CM

## 2013-05-06 LAB — CBC WITH DIFFERENTIAL/PLATELET
Basophils Absolute: 0 K/uL (ref 0.0–0.1)
Basophils Relative: 0.6 % (ref 0.0–3.0)
Eosinophils Absolute: 0.1 K/uL (ref 0.0–0.7)
Eosinophils Relative: 2 % (ref 0.0–5.0)
HCT: 39.7 % (ref 36.0–46.0)
Hemoglobin: 13.7 g/dL (ref 12.0–15.0)
Lymphocytes Relative: 30.3 % (ref 12.0–46.0)
Lymphs Abs: 1.6 K/uL (ref 0.7–4.0)
MCHC: 34.5 g/dL (ref 30.0–36.0)
MCV: 89.1 fl (ref 78.0–100.0)
Monocytes Absolute: 0.3 K/uL (ref 0.1–1.0)
Monocytes Relative: 6.5 % (ref 3.0–12.0)
Neutro Abs: 3.3 K/uL (ref 1.4–7.7)
Neutrophils Relative %: 60.6 % (ref 43.0–77.0)
Platelets: 202 K/uL (ref 150.0–400.0)
RBC: 4.46 Mil/uL (ref 3.87–5.11)
RDW: 13.3 % (ref 11.5–14.6)
WBC: 5.4 K/uL (ref 4.5–10.5)

## 2013-05-06 LAB — HEPATIC FUNCTION PANEL
ALT: 18 U/L (ref 0–35)
AST: 15 U/L (ref 0–37)
Alkaline Phosphatase: 79 U/L (ref 39–117)
Bilirubin, Direct: 0 mg/dL (ref 0.0–0.3)
Total Bilirubin: 0.8 mg/dL (ref 0.3–1.2)

## 2013-05-06 LAB — BASIC METABOLIC PANEL WITH GFR
BUN: 10 mg/dL (ref 6–23)
CO2: 29 meq/L (ref 19–32)
Calcium: 9.4 mg/dL (ref 8.4–10.5)
Chloride: 103 meq/L (ref 96–112)
Creatinine, Ser: 0.7 mg/dL (ref 0.4–1.2)
GFR: 91.97 mL/min
Glucose, Bld: 90 mg/dL (ref 70–99)
Potassium: 4.4 meq/L (ref 3.5–5.1)
Sodium: 137 meq/L (ref 135–145)

## 2013-05-06 LAB — LIPID PANEL
HDL: 70.2 mg/dL (ref >39.00)
VLDL: 12 mg/dL (ref 0.0–40.0)

## 2013-05-06 LAB — TSH: TSH: 2.27 u[IU]/mL (ref 0.35–5.50)

## 2013-05-06 LAB — LDL CHOLESTEROL, DIRECT: Direct LDL: 141.2 mg/dL

## 2013-05-06 MED ORDER — ALENDRONATE SODIUM 70 MG PO TABS: 70.0000 mg | ORAL_TABLET | ORAL | Status: DC

## 2013-05-06 NOTE — Patient Instructions (Signed)
Preventive Care for Adults, Female A healthy lifestyle and preventive care can promote health and wellness. Preventive health guidelines for women include the following key practices.  A routine yearly physical is a good way to check with your caregiver about your health and preventive screening. It is a chance to share any concerns and updates on your health, and to receive a thorough exam.  Visit your dentist for a routine exam and preventive care every 6 months. Brush your teeth twice a day and floss once a day. Good oral hygiene prevents tooth decay and gum disease.  The frequency of eye exams is based on your age, health, family medical history, use of contact lenses, and other factors. Follow your caregiver's recommendations for frequency of eye exams.  Eat a healthy diet. Foods like vegetables, fruits, whole grains, low-fat dairy products, and lean protein foods contain the nutrients you need without too many calories. Decrease your intake of foods high in solid fats, added sugars, and salt. Eat the right amount of calories for you.Get information about a proper diet from your caregiver, if necessary.  Regular physical exercise is one of the most important things you can do for your health. Most adults should get at least 150 minutes of moderate-intensity exercise (any activity that increases your heart rate and causes you to sweat) each week. In addition, most adults need muscle-strengthening exercises on 2 or more days a week.  Maintain a healthy weight. The body mass index (BMI) is a screening tool to identify possible weight problems. It provides an estimate of body fat based on height and weight. Your caregiver can help determine your BMI, and can help you achieve or maintain a healthy weight.For adults 20 years and older:  A BMI below 18.5 is considered underweight.  A BMI of 18.5 to 24.9 is normal.  A BMI of 25 to 29.9 is considered overweight.  A BMI of 30 and above is  considered obese.  Maintain normal blood lipids and cholesterol levels by exercising and minimizing your intake of saturated fat. Eat a balanced diet with plenty of fruit and vegetables. Blood tests for lipids and cholesterol should begin at age 20 and be repeated every 5 years. If your lipid or cholesterol levels are high, you are over 50, or you are at high risk for heart disease, you may need your cholesterol levels checked more frequently.Ongoing high lipid and cholesterol levels should be treated with medicines if diet and exercise are not effective.  If you smoke, find out from your caregiver how to quit. If you do not use tobacco, do not start.  Lung cancer screening is recommended for adults aged 55 80 years who are at high risk for developing lung cancer because of a history of smoking. Yearly low-dose computed tomography (CT) is recommended for people who have at least a 30-pack-year history of smoking and are a current smoker or have quit within the past 15 years. A pack year of smoking is smoking an average of 1 pack of cigarettes a day for 1 year (for example: 1 pack a day for 30 years or 2 packs a day for 15 years). Yearly screening should continue until the smoker has stopped smoking for at least 15 years. Yearly screening should also be stopped for people who develop a health problem that would prevent them from having lung cancer treatment.  If you are pregnant, do not drink alcohol. If you are breastfeeding, be very cautious about drinking alcohol. If you are   not pregnant and choose to drink alcohol, do not exceed 1 drink per day. One drink is considered to be 12 ounces (355 mL) of beer, 5 ounces (148 mL) of wine, or 1.5 ounces (44 mL) of liquor.  Avoid use of street drugs. Do not share needles with anyone. Ask for help if you need support or instructions about stopping the use of drugs.  High blood pressure causes heart disease and increases the risk of stroke. Your blood pressure  should be checked at least every 1 to 2 years. Ongoing high blood pressure should be treated with medicines if weight loss and exercise are not effective.  If you are 55 to 61 years old, ask your caregiver if you should take aspirin to prevent strokes.  Diabetes screening involves taking a blood sample to check your fasting blood sugar level. This should be done once every 3 years, after age 45, if you are within normal weight and without risk factors for diabetes. Testing should be considered at a younger age or be carried out more frequently if you are overweight and have at least 1 risk factor for diabetes.  Breast cancer screening is essential preventive care for women. You should practice "breast self-awareness." This means understanding the normal appearance and feel of your breasts and may include breast self-examination. Any changes detected, no matter how small, should be reported to a caregiver. Women in their 20s and 30s should have a clinical breast exam (CBE) by a caregiver as part of a regular health exam every 1 to 3 years. After age 40, women should have a CBE every year. Starting at age 40, women should consider having a mammography (breast X-ray test) every year. Women who have a family history of breast cancer should talk to their caregiver about genetic screening. Women at a high risk of breast cancer should talk to their caregivers about having magnetic resonance imaging (MRI) and a mammography every year.  Breast cancer gene (BRCA)-related cancer risk assessment is recommended for women who have family members with BRCA-related cancers. BRCA-related cancers include breast, ovarian, tubal, and peritoneal cancers. Having family members with these cancers may be associated with an increased risk for harmful changes (mutations) in the breast cancer genes BRCA1 and BRCA2. Results of the assessment will determine the need for genetic counseling and BRCA1 and BRCA2 testing.  The Pap test is  a screening test for cervical cancer. A Pap test can show cell changes on the cervix that might become cervical cancer if left untreated. A Pap test is a procedure in which cells are obtained and examined from the lower end of the uterus (cervix).  Women should have a Pap test starting at age 21.  Between ages 21 and 29, Pap tests should be repeated every 2 years.  Beginning at age 30, you should have a Pap test every 3 years as long as the past 3 Pap tests have been normal.  Some women have medical problems that increase the chance of getting cervical cancer. Talk to your caregiver about these problems. It is especially important to talk to your caregiver if a new problem develops soon after your last Pap test. In these cases, your caregiver may recommend more frequent screening and Pap tests.  The above recommendations are the same for women who have or have not gotten the vaccine for human papillomavirus (HPV).  If you had a hysterectomy for a problem that was not cancer or a condition that could lead to cancer, then   you no longer need Pap tests. Even if you no longer need a Pap test, a regular exam is a good idea to make sure no other problems are starting.  If you are between ages 65 and 70, and you have had normal Pap tests going back 10 years, you no longer need Pap tests. Even if you no longer need a Pap test, a regular exam is a good idea to make sure no other problems are starting.  If you have had past treatment for cervical cancer or a condition that could lead to cancer, you need Pap tests and screening for cancer for at least 20 years after your treatment.  If Pap tests have been discontinued, risk factors (such as a new sexual partner) need to be reassessed to determine if screening should be resumed.  The HPV test is an additional test that may be used for cervical cancer screening. The HPV test looks for the virus that can cause the cell changes on the cervix. The cells collected  during the Pap test can be tested for HPV. The HPV test could be used to screen women aged 30 years and older, and should be used in women of any age who have unclear Pap test results. After the age of 30, women should have HPV testing at the same frequency as a Pap test.  Colorectal cancer can be detected and often prevented. Most routine colorectal cancer screening begins at the age of 50 and continues through age 75. However, your caregiver may recommend screening at an earlier age if you have risk factors for colon cancer. On a yearly basis, your caregiver may provide home test kits to check for hidden blood in the stool. Use of a small camera at the end of a tube, to directly examine the colon (sigmoidoscopy or colonoscopy), can detect the earliest forms of colorectal cancer. Talk to your caregiver about this at age 50, when routine screening begins. Direct examination of the colon should be repeated every 5 to 10 years through age 75, unless early forms of pre-cancerous polyps or small growths are found.  Hepatitis C blood testing is recommended for all people born from 1945 through 1965 and any individual with known risks for hepatitis C.  Practice safe sex. Use condoms and avoid high-risk sexual practices to reduce the spread of sexually transmitted infections (STIs). STIs include gonorrhea, chlamydia, syphilis, trichomonas, herpes, HPV, and human immunodeficiency virus (HIV). Herpes, HIV, and HPV are viral illnesses that have no cure. They can result in disability, cancer, and death. Sexually active women aged 25 and younger should be checked for chlamydia. Older women with new or multiple partners should also be tested for chlamydia. Testing for other STIs is recommended if you are sexually active and at increased risk.  Osteoporosis is a disease in which the bones lose minerals and strength with aging. This can result in serious bone fractures. The risk of osteoporosis can be identified using a  bone density scan. Women ages 65 and over and women at risk for fractures or osteoporosis should discuss screening with their caregivers. Ask your caregiver whether you should take a calcium supplement or vitamin D to reduce the rate of osteoporosis.  Menopause can be associated with physical symptoms and risks. Hormone replacement therapy is available to decrease symptoms and risks. You should talk to your caregiver about whether hormone replacement therapy is right for you.  Use sunscreen. Apply sunscreen liberally and repeatedly throughout the day. You should seek shade   when your shadow is shorter than you. Protect yourself by wearing long sleeves, pants, a wide-brimmed hat, and sunglasses year round, whenever you are outdoors.  Once a month, do a whole body skin exam, using a mirror to look at the skin on your back. Notify your caregiver of new moles, moles that have irregular borders, moles that are larger than a pencil eraser, or moles that have changed in shape or color.  Stay current with required immunizations.  Influenza vaccine. All adults should be immunized every year.  Tetanus, diphtheria, and acellular pertussis (Td, Tdap) vaccine. Pregnant women should receive 1 dose of Tdap vaccine during each pregnancy. The dose should be obtained regardless of the length of time since the last dose. Immunization is preferred during the 27th to 36th week of gestation. An adult who has not previously received Tdap or who does not know her vaccine status should receive 1 dose of Tdap. This initial dose should be followed by tetanus and diphtheria toxoids (Td) booster doses every 10 years. Adults with an unknown or incomplete history of completing a 3-dose immunization series with Td-containing vaccines should begin or complete a primary immunization series including a Tdap dose. Adults should receive a Td booster every 10 years.  Varicella vaccine. An adult without evidence of immunity to varicella  should receive 2 doses or a second dose if she has previously received 1 dose. Pregnant females who do not have evidence of immunity should receive the first dose after pregnancy. This first dose should be obtained before leaving the health care facility. The second dose should be obtained 4 8 weeks after the first dose.  Human papillomavirus (HPV) vaccine. Females aged 13 26 years who have not received the vaccine previously should obtain the 3-dose series. The vaccine is not recommended for use in pregnant females. However, pregnancy testing is not needed before receiving a dose. If a female is found to be pregnant after receiving a dose, no treatment is needed. In that case, the remaining doses should be delayed until after the pregnancy. Immunization is recommended for any person with an immunocompromised condition through the age of 26 years if she did not get any or all doses earlier. During the 3-dose series, the second dose should be obtained 4 8 weeks after the first dose. The third dose should be obtained 24 weeks after the first dose and 16 weeks after the second dose.  Zoster vaccine. One dose is recommended for adults aged 60 years or older unless certain conditions are present.  Measles, mumps, and rubella (MMR) vaccine. Adults born before 1957 generally are considered immune to measles and mumps. Adults born in 1957 or later should have 1 or more doses of MMR vaccine unless there is a contraindication to the vaccine or there is laboratory evidence of immunity to each of the three diseases. A routine second dose of MMR vaccine should be obtained at least 28 days after the first dose for students attending postsecondary schools, health care workers, or international travelers. People who received inactivated measles vaccine or an unknown type of measles vaccine during 1963 1967 should receive 2 doses of MMR vaccine. People who received inactivated mumps vaccine or an unknown type of mumps vaccine  before 1979 and are at high risk for mumps infection should consider immunization with 2 doses of MMR vaccine. For females of childbearing age, rubella immunity should be determined. If there is no evidence of immunity, females who are not pregnant should be vaccinated. If there   is no evidence of immunity, females who are pregnant should delay immunization until after pregnancy. Unvaccinated health care workers born before 1957 who lack laboratory evidence of measles, mumps, or rubella immunity or laboratory confirmation of disease should consider measles and mumps immunization with 2 doses of MMR vaccine or rubella immunization with 1 dose of MMR vaccine.  Pneumococcal 13-valent conjugate (PCV13) vaccine. When indicated, a person who is uncertain of her immunization history and has no record of immunization should receive the PCV13 vaccine. An adult aged 19 years or older who has certain medical conditions and has not been previously immunized should receive 1 dose of PCV13 vaccine. This PCV13 should be followed with a dose of pneumococcal polysaccharide (PPSV23) vaccine. The PPSV23 vaccine dose should be obtained at least 8 weeks after the dose of PCV13 vaccine. An adult aged 19 years or older who has certain medical conditions and previously received 1 or more doses of PPSV23 vaccine should receive 1 dose of PCV13. The PCV13 vaccine dose should be obtained 1 or more years after the last PPSV23 vaccine dose.  Pneumococcal polysaccharide (PPSV23) vaccine. When PCV13 is also indicated, PCV13 should be obtained first. All adults aged 65 years and older should be immunized. An adult younger than age 65 years who has certain medical conditions should be immunized. Any person who resides in a nursing home or long-term care facility should be immunized. An adult smoker should be immunized. People with an immunocompromised condition and certain other conditions should receive both PCV13 and PPSV23 vaccines. People  with human immunodeficiency virus (HIV) infection should be immunized as soon as possible after diagnosis. Immunization during chemotherapy or radiation therapy should be avoided. Routine use of PPSV23 vaccine is not recommended for American Indians, [place] Natives, or people younger than 65 years unless there are medical conditions that require PPSV23 vaccine. When indicated, people who have unknown immunization and have no record of immunization should receive PPSV23 vaccine. One-time revaccination 5 years after the first dose of PPSV23 is recommended for people aged 19 64 years who have chronic kidney failure, nephrotic syndrome, asplenia, or immunocompromised conditions. People who received 1 2 doses of PPSV23 before age 65 years should receive another dose of PPSV23 vaccine at age 65 years or later if at least 5 years have passed since the previous dose. Doses of PPSV23 are not needed for people immunized with PPSV23 at or after age 65 years.  Meningococcal vaccine. Adults with asplenia or persistent complement component deficiencies should receive 2 doses of quadrivalent meningococcal conjugate (MenACWY-D) vaccine. The doses should be obtained at least 2 months apart. Microbiologists working with certain meningococcal bacteria, military recruits, people at risk during an outbreak, and people who travel to or live in countries with a high rate of meningitis should be immunized. A first-year college student up through age 21 years who is living in a residence hall should receive a dose if she did not receive a dose on or after her 16th birthday. Adults who have certain high-risk conditions should receive one or more doses of vaccine.  Hepatitis A vaccine. Adults who wish to be protected from this disease, have certain high-risk conditions, work with hepatitis A-infected animals, work in hepatitis A research labs, or travel to or work in countries with a high rate of hepatitis A should be immunized. Adults  who were previously unvaccinated and who anticipate close contact with an international adoptee during the first 60 days after arrival in the [place] from a country   with a high rate of hepatitis A should be immunized.  Hepatitis B vaccine. Adults who wish to be protected from this disease, have certain high-risk conditions, may be exposed to blood or other infectious body fluids, are household contacts or sex partners of hepatitis B positive people, are clients or workers in certain care facilities, or travel to or work in countries with a high rate of hepatitis B should be immunized.  Haemophilus influenzae type b (Hib) vaccine. A previously unvaccinated person with asplenia or sickle cell disease or having a scheduled splenectomy should receive 1 dose of Hib vaccine. Regardless of previous immunization, a recipient of a hematopoietic stem cell transplant should receive a 3-dose series 6 12 months after her successful transplant. Hib vaccine is not recommended for adults with HIV infection. Preventive Services / Frequency Ages 19 to 39  Blood pressure check.** / Every 1 to 2 years.  Lipid and cholesterol check.** / Every 5 years beginning at age 20.  Clinical breast exam.** / Every 3 years for women in their 20s and 30s.  BRCA-related cancer risk assessment.** / For women who have family members with a BRCA-related cancer (breast, ovarian, tubal, or peritoneal cancers).  Pap test.** / Every 2 years from ages 21 through 29. Every 3 years starting at age 30 through age 65 or 70 with a history of 3 consecutive normal Pap tests.  HPV screening.** / Every 3 years from ages 30 through ages 65 to 70 with a history of 3 consecutive normal Pap tests.  Hepatitis C blood test.** / For any individual with known risks for hepatitis C.  Skin self-exam. / Monthly.  Influenza vaccine. / Every year.  Tetanus, diphtheria, and acellular pertussis (Tdap, Td) vaccine.** / Consult your caregiver. Pregnant  women should receive 1 dose of Tdap vaccine during each pregnancy. 1 dose of Td every 10 years.  Varicella vaccine.** / Consult your caregiver. Pregnant females who do not have evidence of immunity should receive the first dose after pregnancy.  HPV vaccine. / 3 doses over 6 months, if 26 and younger. The vaccine is not recommended for use in pregnant females. However, pregnancy testing is not needed before receiving a dose.  Measles, mumps, rubella (MMR) vaccine.** / You need at least 1 dose of MMR if you were born in 1957 or later. You may also need a 2nd dose. For females of childbearing age, rubella immunity should be determined. If there is no evidence of immunity, females who are not pregnant should be vaccinated. If there is no evidence of immunity, females who are pregnant should delay immunization until after pregnancy.  Pneumococcal 13-valent conjugate (PCV13) vaccine.** / Consult your caregiver.  Pneumococcal polysaccharide (PPSV23) vaccine.** / 1 to 2 doses if you smoke cigarettes or if you have certain conditions.  Meningococcal vaccine.** / 1 dose if you are age 19 to 21 years and a first-year college student living in a residence hall, or have one of several medical conditions, you need to get vaccinated against meningococcal disease. You may also need additional booster doses.  Hepatitis A vaccine.** / Consult your caregiver.  Hepatitis B vaccine.** / Consult your caregiver.  Haemophilus influenzae type b (Hib) vaccine.** / Consult your caregiver. Ages 40 to 64  Blood pressure check.** / Every 1 to 2 years.  Lipid and cholesterol check.** / Every 5 years beginning at age 20.  Lung cancer screening. / Every year if you are aged 55 80 years and have a 30-pack-year history of smoking and   currently smoke or have quit within the past 15 years. Yearly screening is stopped once you have quit smoking for at least 15 years or develop a health problem that would prevent you from having  lung cancer treatment.  Clinical breast exam.** / Every year after age 40.  BRCA-related cancer risk assessment.** / For women who have family members with a BRCA-related cancer (breast, ovarian, tubal, or peritoneal cancers).  Mammogram.** / Every year beginning at age 40 and continuing for as long as you are in good health. Consult with your caregiver.  Pap test.** / Every 3 years starting at age 30 through age 65 or 70 with a history of 3 consecutive normal Pap tests.  HPV screening.** / Every 3 years from ages 30 through ages 65 to 70 with a history of 3 consecutive normal Pap tests.  Fecal occult blood test (FOBT) of stool. / Every year beginning at age 50 and continuing until age 75. You may not need to do this test if you get a colonoscopy every 10 years.  Flexible sigmoidoscopy or colonoscopy.** / Every 5 years for a flexible sigmoidoscopy or every 10 years for a colonoscopy beginning at age 50 and continuing until age 75.  Hepatitis C blood test.** / For all people born from 1945 through 1965 and any individual with known risks for hepatitis C.  Skin self-exam. / Monthly.  Influenza vaccine. / Every year.  Tetanus, diphtheria, and acellular pertussis (Tdap/Td) vaccine.** / Consult your caregiver. Pregnant women should receive 1 dose of Tdap vaccine during each pregnancy. 1 dose of Td every 10 years.  Varicella vaccine.** / Consult your caregiver. Pregnant females who do not have evidence of immunity should receive the first dose after pregnancy.  Zoster vaccine.** / 1 dose for adults aged 60 years or older.  Measles, mumps, rubella (MMR) vaccine.** / You need at least 1 dose of MMR if you were born in 1957 or later. You may also need a 2nd dose. For females of childbearing age, rubella immunity should be determined. If there is no evidence of immunity, females who are not pregnant should be vaccinated. If there is no evidence of immunity, females who are pregnant should delay  immunization until after pregnancy.  Pneumococcal 13-valent conjugate (PCV13) vaccine.** / Consult your caregiver.  Pneumococcal polysaccharide (PPSV23) vaccine.** / 1 to 2 doses if you smoke cigarettes or if you have certain conditions.  Meningococcal vaccine.** / Consult your caregiver.  Hepatitis A vaccine.** / Consult your caregiver.  Hepatitis B vaccine.** / Consult your caregiver.  Haemophilus influenzae type b (Hib) vaccine.** / Consult your caregiver. Ages 65 and over  Blood pressure check.** / Every 1 to 2 years.  Lipid and cholesterol check.** / Every 5 years beginning at age 20.  Lung cancer screening. / Every year if you are aged 55 80 years and have a 30-pack-year history of smoking and currently smoke or have quit within the past 15 years. Yearly screening is stopped once you have quit smoking for at least 15 years or develop a health problem that would prevent you from having lung cancer treatment.  Clinical breast exam.** / Every year after age 40.  BRCA-related cancer risk assessment.** / For women who have family members with a BRCA-related cancer (breast, ovarian, tubal, or peritoneal cancers).  Mammogram.** / Every year beginning at age 40 and continuing for as long as you are in good health. Consult with your caregiver.  Pap test.** / Every 3 years starting at age   30 through age 65 or 70 with a 3 consecutive normal Pap tests. Testing can be stopped between 65 and 70 with 3 consecutive normal Pap tests and no abnormal Pap or HPV tests in the past 10 years.  HPV screening.** / Every 3 years from ages 30 through ages 65 or 70 with a history of 3 consecutive normal Pap tests. Testing can be stopped between 65 and 70 with 3 consecutive normal Pap tests and no abnormal Pap or HPV tests in the past 10 years.  Fecal occult blood test (FOBT) of stool. / Every year beginning at age 50 and continuing until age 75. You may not need to do this test if you get a colonoscopy  every 10 years.  Flexible sigmoidoscopy or colonoscopy.** / Every 5 years for a flexible sigmoidoscopy or every 10 years for a colonoscopy beginning at age 50 and continuing until age 75.  Hepatitis C blood test.** / For all people born from 1945 through 1965 and any individual with known risks for hepatitis C.  Osteoporosis screening.** / A one-time screening for women ages 65 and over and women at risk for fractures or osteoporosis.  Skin self-exam. / Monthly.  Influenza vaccine. / Every year.  Tetanus, diphtheria, and acellular pertussis (Tdap/Td) vaccine.** / 1 dose of Td every 10 years.  Varicella vaccine.** / Consult your caregiver.  Zoster vaccine.** / 1 dose for adults aged 60 years or older.  Pneumococcal 13-valent conjugate (PCV13) vaccine.** / Consult your caregiver.  Pneumococcal polysaccharide (PPSV23) vaccine.** / 1 dose for all adults aged 65 years and older.  Meningococcal vaccine.** / Consult your caregiver.  Hepatitis A vaccine.** / Consult your caregiver.  Hepatitis B vaccine.** / Consult your caregiver.  Haemophilus influenzae type b (Hib) vaccine.** / Consult your caregiver. ** Family history and personal history of risk and conditions may change your caregiver's recommendations. Document Released: 07/30/2001 Document Revised: 09/28/2012 Document Reviewed: 10/29/2010 [employer] Patient Information 2014 [employer].  

## 2013-05-06 NOTE — Assessment & Plan Note (Signed)
Check labs 

## 2013-05-06 NOTE — Progress Notes (Signed)
Subjective:     Paige Waters is a 62 y.o. female and is here for a comprehensive physical exam. The patient reports problems - shoulder pain.  History   Social History  . Marital Status: Married    Spouse Name: N/A    Number of Children: N/A  . Years of Education: N/A   Occupational History  . Not on file.   Social History Main Topics  . Smoking status: Never Smoker   . Smokeless tobacco: Never Used  . Alcohol Use: Yes  . Drug Use: No  . Sexual Activity: Yes    Partners: Male   Other Topics Concern  . Not on file   Social History Narrative   rowing   Health Maintenance  Topic Date Due  . Zostavax  06/16/2012  . Influenza Vaccine  05/06/2014  . Pap Smear  10/22/2013  . Mammogram  12/24/2013  . Colonoscopy  02/24/2017  . Tetanus/tdap  05/06/2017    The following portions of the patient's history were reviewed and updated as appropriate:  She  has a past medical history of Osteopenia; Thyroid nodule; Colon polyps; Hyperlipidemia; and Uterine hyperplasia. She  does not have any pertinent problems on file. She  has past surgical history that includes laparoscopy. Her family history includes Colon polyps in her brother; Coronary artery disease (age of onset: 12) in her father; Heart attack in her father; Heart disease (age of onset: 72) in her father; Hyperlipidemia in her brother and father; Kidney disease in her brother; Osteoporosis in her mother. There is no history of Diabetes, Hypertension, or Sudden death. She  reports that she has never smoked. She has never used smokeless tobacco. She reports that she drinks alcohol. She reports that she does not use illicit drugs. She has a current medication list which includes the following prescription(s): black cohosh, calcium-magnesium-vitamin d, flax seed oil, red yeast rice, and alendronate. Current Outpatient Prescriptions on File Prior to Visit  Medication Sig Dispense Refill  . Black Cohosh 40 MG CAPS Take 2  capsules by mouth daily.        . Calcium-Magnesium-Vitamin D 500-250-200 MG-MG-UNIT TABS Take 1 tablet by mouth daily.        . Flaxseed, Linseed, (FLAX SEED OIL) 1000 MG CAPS Take 1 capsule by mouth daily.      . Red Yeast Rice 600 MG CAPS Take by mouth.       No current facility-administered medications on file prior to visit.   She has No Known Allergies..  Review of Systems Review of Systems  Constitutional: Negative for activity change, appetite change and fatigue.  HENT: Negative for hearing loss, congestion, tinnitus and ear discharge.  dentist q34m Eyes: Negative for visual disturbance (see optho q1y -- vision corrected to 20/20 with glasses).  Respiratory: Negative for cough, chest tightness and shortness of breath.   Cardiovascular: Negative for chest pain, palpitations and leg swelling.  Gastrointestinal: Negative for abdominal pain, diarrhea, constipation and abdominal distention.  Genitourinary: Negative for urgency, frequency, decreased urine volume and difficulty urinating.  Musculoskeletal: Negative for back pain, arthralgias and gait problem.  Skin: Negative for color change, pallor and rash.  Neurological: Negative for dizziness, light-headedness, numbness and headaches.  Hematological: Negative for adenopathy. Does not bruise/bleed easily.  Psychiatric/Behavioral: Negative for suicidal ideas, confusion, sleep disturbance, self-injury, dysphoric mood, decreased concentration and agitation.       Objective:    BP 106/64  Pulse 68  Temp(Src) 98.6 F (37 C) (Oral)  Ht 5' 4.5" (1.638 m)  Wt 146 lb (66.225 kg)  BMI 24.68 kg/m2  SpO2 96% General appearance: alert, cooperative, appears stated age and no distress Head: Normocephalic, without obvious abnormality, atraumatic Eyes: conjunctivae/corneas clear. PERRL, EOM's intact. Fundi benign. Ears: normal TM's and external ear canals both ears Nose: Nares normal. Septum midline. Mucosa normal. No drainage or sinus  tenderness. Throat: lips, mucosa, and tongue normal; teeth and gums normal Neck: no adenopathy, no carotid bruit, no JVD, supple, symmetrical, trachea midline and thyroid not enlarged, symmetric, no tenderness/mass/nodules Back: symmetric, no curvature. ROM normal. No CVA tenderness. Lungs: clear to auscultation bilaterally Breasts: normal appearance, no masses or tenderness Heart: regular rate and rhythm, S1, S2 normal, no murmur, click, rub or gallop Abdomen: soft, non-tender; bowel sounds normal; no masses,  no organomegaly Pelvic: cervix normal in appearance, external genitalia normal, no adnexal masses or tenderness, no cervical motion tenderness, rectovaginal septum normal, uterus normal size, shape, and consistency, vagina normal without discharge and pap was done Extremities: extremities normal, atraumatic, no cyanosis or edema Pulses: 2+ and symmetric Skin: Skin color, texture, turgor normal. No rashes or lesions Lymph nodes: Cervical, supraclavicular, and axillary nodes normal. Neurologic: Alert and oriented X 3, normal strength and tone. Normal symmetric reflexes. Normal coordination and gait Psych- no depression, no anxiety      Assessment:    Healthy female exam.      Plan:    ghm utd Check labs See After Visit Summary for Counseling Recommendations

## 2013-05-06 NOTE — Progress Notes (Signed)
Pre visit review using our clinic review tool, if applicable. No additional management support is needed unless otherwise documented below in the visit note. 

## 2013-05-06 NOTE — Assessment & Plan Note (Signed)
Check bmd 

## 2013-05-07 LAB — POCT URINALYSIS DIPSTICK
Bilirubin, UA: NEGATIVE
Glucose, UA: NEGATIVE
Nitrite, UA: NEGATIVE
Spec Grav, UA: >=1.03
Urobilinogen, UA: 0.2

## 2013-05-10 ENCOUNTER — Other Ambulatory Visit (HOSPITAL_COMMUNITY)
Admission: RE | Admit: 2013-05-10 | Discharge: 2013-05-10 | Disposition: A | Discharge status: Home / Self Care | Payer: 59 | Source: Ambulatory Visit | Attending: Family Medicine | Admitting: Family Medicine

## 2013-07-22 ENCOUNTER — Telehealth: Payer: Self-pay | Admitting: *Deleted

## 2013-07-22 DIAGNOSIS — M25511 Pain in right shoulder: Secondary | ICD-10-CM

## 2013-07-22 NOTE — Telephone Encounter (Signed)
That is fine 

## 2013-07-22 NOTE — Telephone Encounter (Signed)
Patient called and stated that we referred her to dr Barbaraann Barthel for her shoulder pain. She would like to try another Physician named Kathryne Hitch Percell Miller and Noemi Chapel)

## 2013-07-22 NOTE — Telephone Encounter (Signed)
Please advise      KP 

## 2014-01-05 ENCOUNTER — Encounter: Payer: Self-pay | Admitting: Family Medicine

## 2014-04-23 ENCOUNTER — Other Ambulatory Visit: Payer: Self-pay | Admitting: Family Medicine

## 2014-05-10 ENCOUNTER — Other Ambulatory Visit: Payer: Self-pay | Admitting: Family Medicine

## 2016-01-08 ENCOUNTER — Encounter: Payer: Self-pay | Admitting: Gastroenterology

## 2016-02-06 ENCOUNTER — Encounter: Payer: Self-pay | Admitting: Family Medicine

## 2016-02-06 ENCOUNTER — Other Ambulatory Visit (HOSPITAL_COMMUNITY)
Admission: RE | Admit: 2016-02-06 | Discharge: 2016-02-06 | Disposition: A | Discharge status: Home / Self Care | Payer: 59 | Source: Ambulatory Visit | Attending: Family Medicine | Admitting: Family Medicine

## 2016-02-06 ENCOUNTER — Ambulatory Visit (INDEPENDENT_AMBULATORY_CARE_PROVIDER_SITE_OTHER): Payer: 59 | Admitting: Family Medicine

## 2016-02-06 VITALS — BP 121/74 | HR 73 | Temp 98.1°F | Ht 65.0 in | Wt 148.6 lb

## 2016-02-06 DIAGNOSIS — Z1159 Encounter for screening for other viral diseases: Secondary | ICD-10-CM | POA: Diagnosis not present

## 2016-02-06 DIAGNOSIS — E041 Nontoxic single thyroid nodule: Secondary | ICD-10-CM

## 2016-02-06 DIAGNOSIS — Z01419 Encounter for gynecological examination (general) (routine) without abnormal findings: Secondary | ICD-10-CM | POA: Insufficient documentation

## 2016-02-06 DIAGNOSIS — E785 Hyperlipidemia, unspecified: Secondary | ICD-10-CM

## 2016-02-06 DIAGNOSIS — Z124 Encounter for screening for malignant neoplasm of cervix: Secondary | ICD-10-CM

## 2016-02-06 DIAGNOSIS — Z1151 Encounter for screening for human papillomavirus (HPV): Secondary | ICD-10-CM | POA: Diagnosis not present

## 2016-02-06 DIAGNOSIS — Z0000 Encounter for general adult medical examination without abnormal findings: Secondary | ICD-10-CM | POA: Diagnosis not present

## 2016-02-06 NOTE — Progress Notes (Signed)
Pre visit review using our clinic review tool, if applicable. No additional management support is needed unless otherwise documented below in the visit note. 

## 2016-02-06 NOTE — Patient Instructions (Signed)
Preventive Care for Adults, Female A healthy lifestyle and preventive care can promote health and wellness. Preventive health guidelines for women include the following key practices.  A routine yearly physical is a good way to check with your health care provider about your health and preventive screening. It is a chance to share any concerns and updates on your health and to receive a thorough exam.  Visit your dentist for a routine exam and preventive care every 6 months. Brush your teeth twice a day and floss once a day. Good oral hygiene prevents tooth decay and gum disease.  The frequency of eye exams is based on your age, health, family medical history, use of contact lenses, and other factors. Follow your health care provider's recommendations for frequency of eye exams.  Eat a healthy diet. Foods like vegetables, fruits, whole grains, low-fat dairy products, and lean protein foods contain the nutrients you need without too many calories. Decrease your intake of foods high in solid fats, added sugars, and salt. Eat the right amount of calories for you.Get information about a proper diet from your health care provider, if necessary.  Regular physical exercise is one of the most important things you can do for your health. Most adults should get at least 150 minutes of moderate-intensity exercise (any activity that increases your heart rate and causes you to sweat) each week. In addition, most adults need muscle-strengthening exercises on 2 or more days a week.  Maintain a healthy weight. The body mass index (BMI) is a screening tool to identify possible weight problems. It provides an estimate of body fat based on height and weight. Your health care provider can find your BMI and can help you achieve or maintain a healthy weight.For adults 20 years and older:  A BMI below 18.5 is considered underweight.  A BMI of 18.5 to 24.9 is normal.  A BMI of 25 to 29.9 is considered overweight.  A  BMI of 30 and above is considered obese.  Maintain normal blood lipids and cholesterol levels by exercising and minimizing your intake of saturated fat. Eat a balanced diet with plenty of fruit and vegetables. Blood tests for lipids and cholesterol should begin at age 45 and be repeated every 5 years. If your lipid or cholesterol levels are high, you are over 50, or you are at high risk for heart disease, you may need your cholesterol levels checked more frequently.Ongoing high lipid and cholesterol levels should be treated with medicines if diet and exercise are not working.  If you smoke, find out from your health care provider how to quit. If you do not use tobacco, do not start.  Lung cancer screening is recommended for adults aged 45-80 years who are at high risk for developing lung cancer because of a history of smoking. A yearly low-dose CT scan of the lungs is recommended for people who have at least a 30-pack-year history of smoking and are a current smoker or have quit within the past 15 years. A pack year of smoking is smoking an average of 1 pack of cigarettes a day for 1 year (for example: 1 pack a day for 30 years or 2 packs a day for 15 years). Yearly screening should continue until the smoker has stopped smoking for at least 15 years. Yearly screening should be stopped for people who develop a health problem that would prevent them from having lung cancer treatment.  If you are pregnant, do not drink alcohol. If you are  breastfeeding, be very cautious about drinking alcohol. If you are not pregnant and choose to drink alcohol, do not have more than 1 drink per day. One drink is considered to be 12 ounces (355 mL) of beer, 5 ounces (148 mL) of wine, or 1.5 ounces (44 mL) of liquor.  Avoid use of street drugs. Do not share needles with anyone. Ask for help if you need support or instructions about stopping the use of drugs.  High blood pressure causes heart disease and increases the risk  of stroke. Your blood pressure should be checked at least every 1 to 2 years. Ongoing high blood pressure should be treated with medicines if weight loss and exercise do not work.  If you are 55-79 years old, ask your health care provider if you should take aspirin to prevent strokes.  Diabetes screening is done by taking a blood sample to check your blood glucose level after you have not eaten for a certain period of time (fasting). If you are not overweight and you do not have risk factors for diabetes, you should be screened once every 3 years starting at age 45. If you are overweight or obese and you are 40-70 years of age, you should be screened for diabetes every year as part of your cardiovascular risk assessment.  Breast cancer screening is essential preventive care for women. You should practice "breast self-awareness." This means understanding the normal appearance and feel of your breasts and may include breast self-examination. Any changes detected, no matter how small, should be reported to a health care provider. Women in their 20s and 30s should have a clinical breast exam (CBE) by a health care provider as part of a regular health exam every 1 to 3 years. After age 40, women should have a CBE every year. Starting at age 40, women should consider having a mammogram (breast X-ray test) every year. Women who have a family history of breast cancer should talk to their health care provider about genetic screening. Women at a high risk of breast cancer should talk to their health care providers about having an MRI and a mammogram every year.  Breast cancer gene (BRCA)-related cancer risk assessment is recommended for women who have family members with BRCA-related cancers. BRCA-related cancers include breast, ovarian, tubal, and peritoneal cancers. Having family members with these cancers may be associated with an increased risk for harmful changes (mutations) in the breast cancer genes BRCA1 and  BRCA2. Results of the assessment will determine the need for genetic counseling and BRCA1 and BRCA2 testing.  Your health care provider may recommend that you be screened regularly for cancer of the pelvic organs (ovaries, uterus, and vagina). This screening involves a pelvic examination, including checking for microscopic changes to the surface of your cervix (Pap test). You may be encouraged to have this screening done every 3 years, beginning at age 21.  For women ages 30-65, health care providers may recommend pelvic exams and Pap testing every 3 years, or they may recommend the Pap and pelvic exam, combined with testing for human papilloma virus (HPV), every 5 years. Some types of HPV increase your risk of cervical cancer. Testing for HPV may also be done on women of any age with unclear Pap test results.  Other health care providers may not recommend any screening for nonpregnant women who are considered low risk for pelvic cancer and who do not have symptoms. Ask your health care provider if a screening pelvic exam is right for   you.  If you have had past treatment for cervical cancer or a condition that could lead to cancer, you need Pap tests and screening for cancer for at least 20 years after your treatment. If Pap tests have been discontinued, your risk factors (such as having a new sexual partner) need to be reassessed to determine if screening should resume. Some women have medical problems that increase the chance of getting cervical cancer. In these cases, your health care provider may recommend more frequent screening and Pap tests.  Colorectal cancer can be detected and often prevented. Most routine colorectal cancer screening begins at the age of 50 years and continues through age 75 years. However, your health care provider may recommend screening at an earlier age if you have risk factors for colon cancer. On a yearly basis, your health care provider may provide home test kits to check  for hidden blood in the stool. Use of a small camera at the end of a tube, to directly examine the colon (sigmoidoscopy or colonoscopy), can detect the earliest forms of colorectal cancer. Talk to your health care provider about this at age 50, when routine screening begins. Direct exam of the colon should be repeated every 5-10 years through age 75 years, unless early forms of precancerous polyps or small growths are found.  People who are at an increased risk for hepatitis B should be screened for this virus. You are considered at high risk for hepatitis B if:  You were born in a country where hepatitis B occurs often. Talk with your health care provider about which countries are considered high risk.  Your parents were born in a high-risk country and you have not received a shot to protect against hepatitis B (hepatitis B vaccine).  You have HIV or AIDS.  You use needles to inject street drugs.  You live with, or have sex with, someone who has hepatitis B.  You get hemodialysis treatment.  You take certain medicines for conditions like cancer, organ transplantation, and autoimmune conditions.  Hepatitis C blood testing is recommended for all people born from 1945 through 1965 and any individual with known risks for hepatitis C.  Practice safe sex. Use condoms and avoid high-risk sexual practices to reduce the spread of sexually transmitted infections (STIs). STIs include gonorrhea, chlamydia, syphilis, trichomonas, herpes, HPV, and human immunodeficiency virus (HIV). Herpes, HIV, and HPV are viral illnesses that have no cure. They can result in disability, cancer, and death.  You should be screened for sexually transmitted illnesses (STIs) including gonorrhea and chlamydia if:  You are sexually active and are younger than 24 years.  You are older than 24 years and your health care provider tells you that you are at risk for this type of infection.  Your sexual activity has changed  since you were last screened and you are at an increased risk for chlamydia or gonorrhea. Ask your health care provider if you are at risk.  If you are at risk of being infected with HIV, it is recommended that you take a prescription medicine daily to prevent HIV infection. This is called preexposure prophylaxis (PrEP). You are considered at risk if:  You are sexually active and do not regularly use condoms or know the HIV status of your partner(s).  You take drugs by injection.  You are sexually active with a partner who has HIV.  Talk with your health care provider about whether you are at high risk of being infected with HIV. If   you choose to begin PrEP, you should first be tested for HIV. You should then be tested every 3 months for as long as you are taking PrEP.  Osteoporosis is a disease in which the bones lose minerals and strength with aging. This can result in serious bone fractures or breaks. The risk of osteoporosis can be identified using a bone density scan. Women ages 67 years and over and women at risk for fractures or osteoporosis should discuss screening with their health care providers. Ask your health care provider whether you should take a calcium supplement or vitamin D to reduce the rate of osteoporosis.  Menopause can be associated with physical symptoms and risks. Hormone replacement therapy is available to decrease symptoms and risks. You should talk to your health care provider about whether hormone replacement therapy is right for you.  Use sunscreen. Apply sunscreen liberally and repeatedly throughout the day. You should seek shade when your shadow is shorter than you. Protect yourself by wearing long sleeves, pants, a wide-brimmed hat, and sunglasses year round, whenever you are outdoors.  Once a month, do a whole body skin exam, using a mirror to look at the skin on your back. Tell your health care provider of new moles, moles that have irregular borders, moles that  are larger than a pencil eraser, or moles that have changed in shape or color.  Stay current with required vaccines (immunizations).  Influenza vaccine. All adults should be immunized every year.  Tetanus, diphtheria, and acellular pertussis (Td, Tdap) vaccine. Pregnant women should receive 1 dose of Tdap vaccine during each pregnancy. The dose should be obtained regardless of the length of time since the last dose. Immunization is preferred during the 27th-36th week of gestation. An adult who has not previously received Tdap or who does not know her vaccine status should receive 1 dose of Tdap. This initial dose should be followed by tetanus and diphtheria toxoids (Td) booster doses every 10 years. Adults with an unknown or incomplete history of completing a 3-dose immunization series with Td-containing vaccines should begin or complete a primary immunization series including a Tdap dose. Adults should receive a Td booster every 10 years.  Varicella vaccine. An adult without evidence of immunity to varicella should receive 2 doses or a second dose if she has previously received 1 dose. Pregnant females who do not have evidence of immunity should receive the first dose after pregnancy. This first dose should be obtained before leaving the health care facility. The second dose should be obtained 4-8 weeks after the first dose.  Human papillomavirus (HPV) vaccine. Females aged 13-26 years who have not received the vaccine previously should obtain the 3-dose series. The vaccine is not recommended for use in pregnant females. However, pregnancy testing is not needed before receiving a dose. If a female is found to be pregnant after receiving a dose, no treatment is needed. In that case, the remaining doses should be delayed until after the pregnancy. Immunization is recommended for any person with an immunocompromised condition through the age of 61 years if she did not get any or all doses earlier. During the  3-dose series, the second dose should be obtained 4-8 weeks after the first dose. The third dose should be obtained 24 weeks after the first dose and 16 weeks after the second dose.  Zoster vaccine. One dose is recommended for adults aged 30 years or older unless certain conditions are present.  Measles, mumps, and rubella (MMR) vaccine. Adults born  before 1957 generally are considered immune to measles and mumps. Adults born in 1957 or later should have 1 or more doses of MMR vaccine unless there is a contraindication to the vaccine or there is laboratory evidence of immunity to each of the three diseases. A routine second dose of MMR vaccine should be obtained at least 28 days after the first dose for students attending postsecondary schools, health care workers, or international travelers. People who received inactivated measles vaccine or an unknown type of measles vaccine during 1963-1967 should receive 2 doses of MMR vaccine. People who received inactivated mumps vaccine or an unknown type of mumps vaccine before 1979 and are at high risk for mumps infection should consider immunization with 2 doses of MMR vaccine. For females of childbearing age, rubella immunity should be determined. If there is no evidence of immunity, females who are not pregnant should be vaccinated. If there is no evidence of immunity, females who are pregnant should delay immunization until after pregnancy. Unvaccinated health care workers born before 1957 who lack laboratory evidence of measles, mumps, or rubella immunity or laboratory confirmation of disease should consider measles and mumps immunization with 2 doses of MMR vaccine or rubella immunization with 1 dose of MMR vaccine.  Pneumococcal 13-valent conjugate (PCV13) vaccine. When indicated, a person who is uncertain of his immunization history and has no record of immunization should receive the PCV13 vaccine. All adults 65 years of age and older should receive this  vaccine. An adult aged 19 years or older who has certain medical conditions and has not been previously immunized should receive 1 dose of PCV13 vaccine. This PCV13 should be followed with a dose of pneumococcal polysaccharide (PPSV23) vaccine. Adults who are at high risk for pneumococcal disease should obtain the PPSV23 vaccine at least 8 weeks after the dose of PCV13 vaccine. Adults older than 65 years of age who have normal immune system function should obtain the PPSV23 vaccine dose at least 1 year after the dose of PCV13 vaccine.  Pneumococcal polysaccharide (PPSV23) vaccine. When PCV13 is also indicated, PCV13 should be obtained first. All adults aged 65 years and older should be immunized. An adult younger than age 65 years who has certain medical conditions should be immunized. Any person who resides in a nursing home or long-term care facility should be immunized. An adult smoker should be immunized. People with an immunocompromised condition and certain other conditions should receive both PCV13 and PPSV23 vaccines. People with human immunodeficiency virus (HIV) infection should be immunized as soon as possible after diagnosis. Immunization during chemotherapy or radiation therapy should be avoided. Routine use of PPSV23 vaccine is not recommended for American Indians, [place] Natives, or people younger than 65 years unless there are medical conditions that require PPSV23 vaccine. When indicated, people who have unknown immunization and have no record of immunization should receive PPSV23 vaccine. One-time revaccination 5 years after the first dose of PPSV23 is recommended for people aged 19-64 years who have chronic kidney failure, nephrotic syndrome, asplenia, or immunocompromised conditions. People who received 1-2 doses of PPSV23 before age 65 years should receive another dose of PPSV23 vaccine at age 65 years or later if at least 5 years have passed since the previous dose. Doses of PPSV23 are not  needed for people immunized with PPSV23 at or after age 65 years.  Meningococcal vaccine. Adults with asplenia or persistent complement component deficiencies should receive 2 doses of quadrivalent meningococcal conjugate (MenACWY-D) vaccine. The doses should be obtained   at least 2 months apart. Microbiologists working with certain meningococcal bacteria, [place] recruits, people at risk during an outbreak, and people who travel to or live in countries with a high rate of meningitis should be immunized. A first-year college student up through age 34 years who is living in a residence hall should receive a dose if she did not receive a dose on or after her 16th birthday. Adults who have certain high-risk conditions should receive one or more doses of vaccine.  Hepatitis A vaccine. Adults who wish to be protected from this disease, have certain high-risk conditions, work with hepatitis A-infected animals, work in hepatitis A research labs, or travel to or work in countries with a high rate of hepatitis A should be immunized. Adults who were previously unvaccinated and who anticipate close contact with an international adoptee during the first 60 days after arrival in the [place] from a country with a high rate of hepatitis A should be immunized.  Hepatitis B vaccine. Adults who wish to be protected from this disease, have certain high-risk conditions, may be exposed to blood or other infectious body fluids, are household contacts or sex partners of hepatitis B positive people, are clients or workers in certain care facilities, or travel to or work in countries with a high rate of hepatitis B should be immunized.  Haemophilus influenzae type b (Hib) vaccine. A previously unvaccinated person with asplenia or sickle cell disease or having a scheduled splenectomy should receive 1 dose of Hib vaccine. Regardless of previous immunization, a recipient of a hematopoietic stem cell transplant should receive a  3-dose series 6-12 months after her successful transplant. Hib vaccine is not recommended for adults with HIV infection. Preventive Services / Frequency Ages 35 to 4 years  Blood pressure check.** / Every 3-5 years.  Lipid and cholesterol check.** / Every 5 years beginning at age 60.  Clinical breast exam.** / Every 3 years for women in their 71s and 10s.  BRCA-related cancer risk assessment.** / For women who have family members with a BRCA-related cancer (breast, ovarian, tubal, or peritoneal cancers).  Pap test.** / Every 2 years from ages 76 through 26. Every 3 years starting at age 61 through age 76 or 93 with a history of 3 consecutive normal Pap tests.  HPV screening.** / Every 3 years from ages 37 through ages 60 to 51 with a history of 3 consecutive normal Pap tests.  Hepatitis C blood test.** / For any individual with known risks for hepatitis C.  Skin self-exam. / Monthly.  Influenza vaccine. / Every year.  Tetanus, diphtheria, and acellular pertussis (Tdap, Td) vaccine.** / Consult your health care provider. Pregnant women should receive 1 dose of Tdap vaccine during each pregnancy. 1 dose of Td every 10 years.  Varicella vaccine.** / Consult your health care provider. Pregnant females who do not have evidence of immunity should receive the first dose after pregnancy.  HPV vaccine. / 3 doses over 6 months, if 93 and younger. The vaccine is not recommended for use in pregnant females. However, pregnancy testing is not needed before receiving a dose.  Measles, mumps, rubella (MMR) vaccine.** / You need at least 1 dose of MMR if you were born in 1957 or later. You may also need a 2nd dose. For females of childbearing age, rubella immunity should be determined. If there is no evidence of immunity, females who are not pregnant should be vaccinated. If there is no evidence of immunity, females who are  pregnant should delay immunization until after pregnancy.  Pneumococcal  13-valent conjugate (PCV13) vaccine.** / Consult your health care provider.  Pneumococcal polysaccharide (PPSV23) vaccine.** / 1 to 2 doses if you smoke cigarettes or if you have certain conditions.  Meningococcal vaccine.** / 1 dose if you are age 68 to 8 years and a Market researcher living in a residence hall, or have one of several medical conditions, you need to get vaccinated against meningococcal disease. You may also need additional booster doses.  Hepatitis A vaccine.** / Consult your health care provider.  Hepatitis B vaccine.** / Consult your health care provider.  Haemophilus influenzae type b (Hib) vaccine.** / Consult your health care provider. Ages 7 to 53 years  Blood pressure check.** / Every year.  Lipid and cholesterol check.** / Every 5 years beginning at age 25 years.  Lung cancer screening. / Every year if you are aged 11-80 years and have a 30-pack-year history of smoking and currently smoke or have quit within the past 15 years. Yearly screening is stopped once you have quit smoking for at least 15 years or develop a health problem that would prevent you from having lung cancer treatment.  Clinical breast exam.** / Every year after age 48 years.  BRCA-related cancer risk assessment.** / For women who have family members with a BRCA-related cancer (breast, ovarian, tubal, or peritoneal cancers).  Mammogram.** / Every year beginning at age 41 years and continuing for as long as you are in good health. Consult with your health care provider.  Pap test.** / Every 3 years starting at age 65 years through age 37 or 70 years with a history of 3 consecutive normal Pap tests.  HPV screening.** / Every 3 years from ages 72 years through ages 60 to 40 years with a history of 3 consecutive normal Pap tests.  Fecal occult blood test (FOBT) of stool. / Every year beginning at age 21 years and continuing until age 5 years. You may not need to do this test if you get  a colonoscopy every 10 years.  Flexible sigmoidoscopy or colonoscopy.** / Every 5 years for a flexible sigmoidoscopy or every 10 years for a colonoscopy beginning at age 35 years and continuing until age 48 years.  Hepatitis C blood test.** / For all people born from [birth year] through 1965 and any individual with known risks for hepatitis C.  Skin self-exam. / Monthly.  Influenza vaccine. / Every year.  Tetanus, diphtheria, and acellular pertussis (Tdap/Td) vaccine.** / Consult your health care provider. Pregnant women should receive 1 dose of Tdap vaccine during each pregnancy. 1 dose of Td every 10 years.  Varicella vaccine.** / Consult your health care provider. Pregnant females who do not have evidence of immunity should receive the first dose after pregnancy.  Zoster vaccine.** / 1 dose for adults aged 30 years or older.  Measles, mumps, rubella (MMR) vaccine.** / You need at least 1 dose of MMR if you were born in 1957 or later. You may also need a second dose. For females of childbearing age, rubella immunity should be determined. If there is no evidence of immunity, females who are not pregnant should be vaccinated. If there is no evidence of immunity, females who are pregnant should delay immunization until after pregnancy.  Pneumococcal 13-valent conjugate (PCV13) vaccine.** / Consult your health care provider.  Pneumococcal polysaccharide (PPSV23) vaccine.** / 1 to 2 doses if you smoke cigarettes or if you have certain conditions.  Meningococcal vaccine.** /  Consult your health care provider.  Hepatitis A vaccine.** / Consult your health care provider.  Hepatitis B vaccine.** / Consult your health care provider.  Haemophilus influenzae type b (Hib) vaccine.** / Consult your health care provider. Ages 64 years and over  Blood pressure check.** / Every year.  Lipid and cholesterol check.** / Every 5 years beginning at age 23 years.  Lung cancer screening. / Every year if you  are aged 16-80 years and have a 30-pack-year history of smoking and currently smoke or have quit within the past 15 years. Yearly screening is stopped once you have quit smoking for at least 15 years or develop a health problem that would prevent you from having lung cancer treatment.  Clinical breast exam.** / Every year after age 74 years.  BRCA-related cancer risk assessment.** / For women who have family members with a BRCA-related cancer (breast, ovarian, tubal, or peritoneal cancers).  Mammogram.** / Every year beginning at age 44 years and continuing for as long as you are in good health. Consult with your health care provider.  Pap test.** / Every 3 years starting at age 58 years through age 22 or 39 years with 3 consecutive normal Pap tests. Testing can be stopped between 65 and 70 years with 3 consecutive normal Pap tests and no abnormal Pap or HPV tests in the past 10 years.  HPV screening.** / Every 3 years from ages 64 years through ages 70 or 61 years with a history of 3 consecutive normal Pap tests. Testing can be stopped between 65 and 70 years with 3 consecutive normal Pap tests and no abnormal Pap or HPV tests in the past 10 years.  Fecal occult blood test (FOBT) of stool. / Every year beginning at age 40 years and continuing until age 27 years. You may not need to do this test if you get a colonoscopy every 10 years.  Flexible sigmoidoscopy or colonoscopy.** / Every 5 years for a flexible sigmoidoscopy or every 10 years for a colonoscopy beginning at age 7 years and continuing until age 32 years.  Hepatitis C blood test.** / For all people born from [birth year] through 1964 and any individual with known risks for hepatitis C.  Osteoporosis screening.** / A one-time screening for women ages 30 years and over and women at risk for fractures or osteoporosis.  Skin self-exam. / Monthly.  Influenza vaccine. / Every year.  Tetanus, diphtheria, and acellular pertussis (Tdap/Td)  vaccine.** / 1 dose of Td every 10 years.  Varicella vaccine.** / Consult your health care provider.  Zoster vaccine.** / 1 dose for adults aged 35 years or older.  Pneumococcal 13-valent conjugate (PCV13) vaccine.** / Consult your health care provider.  Pneumococcal polysaccharide (PPSV23) vaccine.** / 1 dose for all adults aged 46 years and older.  Meningococcal vaccine.** / Consult your health care provider.  Hepatitis A vaccine.** / Consult your health care provider.  Hepatitis B vaccine.** / Consult your health care provider.  Haemophilus influenzae type b (Hib) vaccine.** / Consult your health care provider. ** Family history and personal history of risk and conditions may change your health care provider's recommendations.   This information is not intended to replace advice given to you by your health care provider. Make sure you discuss any questions you have with your health care provider.   Document Released: 07/30/2001 Document Revised: 06/24/2014 Document Reviewed: 10/29/2010 Elsevier Interactive Patient Education [employer].

## 2016-02-06 NOTE — Progress Notes (Signed)
Subjective:     Paige Waters is a 65 y.o. female and is here for a comprehensive physical exam. The patient reports no problems.  Social History   Social History  . Marital status: Married    Spouse name: N/A  . Number of children: N/A  . Years of education: N/A   Occupational History  . Not on file.   Social History Main Topics  . Smoking status: Never Smoker  . Smokeless tobacco: Never Used  . Alcohol use Yes  . Drug use: No  . Sexual activity: Yes    Partners: Male   Other Topics Concern  . Not on file   Social History Narrative   rowing   Health Maintenance  Topic Date Due  . Hepatitis C Screening  [birth year]  . HIV Screening  06/17/1967  . MAMMOGRAM  12/17/2015  . INFLUENZA VACCINE  01/16/2016  . PAP SMEAR  05/06/2016  . COLONOSCOPY  02/24/2017  . TETANUS/TDAP  05/06/2017  . ZOSTAVAX  Completed    The following portions of the patient's history were reviewed and updated as appropriate:  She  has a past medical history of Colon polyps; Hyperlipidemia; Osteopenia; Thyroid nodule; and Uterine hyperplasia. She  does not have any pertinent problems on file. She  has a past surgical history that includes laparoscopy. Her family history includes Colon polyps in her brother; Coronary artery disease (age of onset: 23) in her father; Heart attack in her father; Heart disease (age of onset: 35) in her father; Hyperlipidemia in her brother and father; Kidney disease in her brother; Osteoporosis in her mother. She  reports that she has never smoked. She has never used smokeless tobacco. She reports that she drinks alcohol. She reports that she does not use drugs. She has a current medication list which includes the following prescription(s): black cohosh, calcium-magnesium-vitamin d, and red yeast rice. Current Outpatient Prescriptions on File Prior to Visit  Medication Sig Dispense Refill  . Black Cohosh 40 MG CAPS Take 2 capsules by mouth daily.      .  Calcium-Magnesium-Vitamin D [id number] MG-MG-UNIT TABS Take 1 tablet by mouth daily.      . Red Yeast Rice 600 MG CAPS Take by mouth.     No current facility-administered medications on file prior to visit.    She has No Known Allergies..  Review of Systems Review of Systems  Constitutional: Negative for activity change, appetite change and fatigue.  HENT: Negative for hearing loss, congestion, tinnitus and ear discharge.  dentist q39m Eyes: Negative for visual disturbance (see optho q1y -- vision corrected to 20/20 with glasses).  Respiratory: Negative for cough, chest tightness and shortness of breath.   Cardiovascular: Negative for chest pain, palpitations and leg swelling.  Gastrointestinal: Negative for abdominal pain, diarrhea, constipation and abdominal distention.  Genitourinary: Negative for urgency, frequency, decreased urine volume and difficulty urinating.  Musculoskeletal: Negative for back pain, arthralgias and gait problem.  Skin: Negative for color change, pallor and rash.  Neurological: Negative for dizziness, light-headedness, numbness and headaches.  Hematological: Negative for adenopathy. Does not bruise/bleed easily.  Psychiatric/Behavioral: Negative for suicidal ideas, confusion, sleep disturbance, self-injury, dysphoric mood, decreased concentration and agitation.       Objective:    BP 121/74 (BP Location: Left Arm, Patient Position: Sitting, Cuff Size: Normal)   Pulse 73   Temp 98.1 F (36.7 C) (Oral)   Ht 5\' 5"  (1.651 m)   Wt 148 lb 9.6 oz (67.4 kg)   SpO2  95%   BMI 24.73 kg/m  General appearance: alert, cooperative, appears stated age and no distress Head: Normocephalic, without obvious abnormality, atraumatic Eyes: conjunctivae/corneas clear. PERRL, EOM's intact. Fundi benign. Ears: normal TM's and external ear canals both ears Nose: Nares normal. Septum midline. Mucosa normal. No drainage or sinus tenderness. Throat: lips, mucosa, and tongue  normal; teeth and gums normal Neck: no adenopathy, no carotid bruit, no JVD, supple, symmetrical, trachea midline and thyroid not enlarged, symmetric, no tenderness/mass/nodules Back: symmetric, no curvature. ROM normal. No CVA tenderness. Lungs: clear to auscultation bilaterally Breasts: normal appearance, no masses or tenderness Heart: regular rate and rhythm, S1, S2 normal, no murmur, click, rub or gallop Abdomen: soft, non-tender; bowel sounds normal; no masses,  no organomegaly Pelvic: cervix normal in appearance, external genitalia normal, no adnexal masses or tenderness, no cervical motion tenderness, rectovaginal septum normal, uterus normal size, shape, and consistency, vagina normal without discharge and pap done Extremities: extremities normal, atraumatic, no cyanosis or edema Pulses: 2+ and symmetric Skin: Skin color, texture, turgor normal. No rashes or lesions Lymph nodes: Cervical, supraclavicular, and axillary nodes normal. Neurologic: Alert and oriented X 3, normal strength and tone. Normal symmetric reflexes. Normal coordination and gait    Assessment:    Healthy female exam.    Plan:    ghm utd Check labs See After Visit Summary for Counseling Recommendations    1. Thyroid nodule   - CBC with Differential/Platelet; Future - TSH; Future  2. Hyperlipidemia Check labs - Comprehensive metabolic panel; Future - Lipid panel; Future - CBC with Differential/Platelet; Future - Urinalysis, Routine w reflex microscopic; Future  3. Preventative health care   - Urinalysis, Routine w reflex microscopic; Future  4. Need for hepatitis C screening test   - Hepatitis C antibody; Future  5. Screening for malignant neoplasm of cervix   - Cytology - PAP

## 2016-02-08 ENCOUNTER — Other Ambulatory Visit (INDEPENDENT_AMBULATORY_CARE_PROVIDER_SITE_OTHER): Payer: 59

## 2016-02-08 DIAGNOSIS — E785 Hyperlipidemia, unspecified: Secondary | ICD-10-CM

## 2016-02-08 DIAGNOSIS — Z0000 Encounter for general adult medical examination without abnormal findings: Secondary | ICD-10-CM

## 2016-02-08 DIAGNOSIS — Z1159 Encounter for screening for other viral diseases: Secondary | ICD-10-CM

## 2016-02-08 DIAGNOSIS — E041 Nontoxic single thyroid nodule: Secondary | ICD-10-CM | POA: Diagnosis not present

## 2016-02-08 LAB — LIPID PANEL
CHOLESTEROL: 222 mg/dL — AB (ref 0–200)
HDL: 69.8 mg/dL (ref >39.00)
LDL Cholesterol: 134 mg/dL — ABNORMAL HIGH (ref 0–99)
NONHDL: 151.81
Total CHOL/HDL Ratio: 3
Triglycerides: 91 mg/dL (ref 0.0–149.0)
VLDL: 18.2 mg/dL (ref 0.0–40.0)

## 2016-02-08 LAB — CBC WITH DIFFERENTIAL/PLATELET
Basophils Absolute: 0 10*3/uL (ref 0.0–0.1)
Basophils Relative: 0.6 % (ref 0.0–3.0)
EOS PCT: 4.6 % (ref 0.0–5.0)
Eosinophils Absolute: 0.2 10*3/uL (ref 0.0–0.7)
HEMATOCRIT: 40 % (ref 36.0–46.0)
HEMOGLOBIN: 13.7 g/dL (ref 12.0–15.0)
LYMPHS PCT: 40.2 % (ref 12.0–46.0)
Lymphs Abs: 1.7 10*3/uL (ref 0.7–4.0)
MCHC: 34.3 g/dL (ref 30.0–36.0)
MCV: 89.4 fl (ref 78.0–100.0)
MONO ABS: 0.3 10*3/uL (ref 0.1–1.0)
MONOS PCT: 8.5 % (ref 3.0–12.0)
Neutro Abs: 1.9 10*3/uL (ref 1.4–7.7)
Neutrophils Relative %: 46.1 % (ref 43.0–77.0)
Platelets: 179 10*3/uL (ref 150.0–400.0)
RBC: 4.47 Mil/uL (ref 3.87–5.11)
RDW: 13.9 % (ref 11.5–15.5)
WBC: 4.1 10*3/uL (ref 4.0–10.5)

## 2016-02-08 LAB — URINALYSIS, ROUTINE W REFLEX MICROSCOPIC
Bilirubin Urine: NEGATIVE
KETONES UR: NEGATIVE
Leukocytes, UA: NEGATIVE
NITRITE: NEGATIVE
Specific Gravity, Urine: 1.02 (ref 1.000–1.030)
Total Protein, Urine: NEGATIVE
URINE GLUCOSE: NEGATIVE
Urobilinogen, UA: 0.2 (ref 0.0–1.0)
pH: 6 (ref 5.0–8.0)

## 2016-02-08 LAB — COMPREHENSIVE METABOLIC PANEL
ALBUMIN: 4.2 g/dL (ref 3.5–5.2)
ALK PHOS: 75 U/L (ref 39–117)
ALT: 14 U/L (ref 0–35)
AST: 15 U/L (ref 0–37)
BILIRUBIN TOTAL: 0.5 mg/dL (ref 0.2–1.2)
BUN: 10 mg/dL (ref 6–23)
CO2: 30 mEq/L (ref 19–32)
CREATININE: 0.81 mg/dL (ref 0.40–1.20)
Calcium: 9.3 mg/dL (ref 8.4–10.5)
Chloride: 106 mEq/L (ref 96–112)
GFR: 75.75 mL/min (ref >60.00)
GLUCOSE: 91 mg/dL (ref 70–99)
Potassium: 4.5 mEq/L (ref 3.5–5.1)
SODIUM: 142 meq/L (ref 135–145)
TOTAL PROTEIN: 6.8 g/dL (ref 6.0–8.3)

## 2016-02-08 LAB — CYTOLOGY - PAP

## 2016-02-08 LAB — TSH: TSH: 4.94 u[IU]/mL — AB (ref 0.35–4.50)

## 2016-02-09 ENCOUNTER — Other Ambulatory Visit: Payer: Self-pay

## 2016-02-09 DIAGNOSIS — R946 Abnormal results of thyroid function studies: Secondary | ICD-10-CM

## 2016-02-09 LAB — HEPATITIS C ANTIBODY: HCV AB: NEGATIVE

## 2016-02-23 ENCOUNTER — Other Ambulatory Visit (INDEPENDENT_AMBULATORY_CARE_PROVIDER_SITE_OTHER): Payer: 59

## 2016-02-23 DIAGNOSIS — R946 Abnormal results of thyroid function studies: Secondary | ICD-10-CM

## 2016-02-23 LAB — TSH: TSH: 4.47 u[IU]/mL (ref 0.35–4.50)

## 2016-03-20 ENCOUNTER — Telehealth: Payer: Self-pay | Admitting: Family Medicine

## 2016-03-20 NOTE — Telephone Encounter (Signed)
Pt called in to request her lab results.    CB: [phone]

## 2016-03-21 NOTE — Telephone Encounter (Signed)
Patient has been made aware that the TSH was Normal.   KP

## 2016-05-23 ENCOUNTER — Encounter: Payer: Self-pay | Admitting: Gastroenterology

## 2016-06-13 ENCOUNTER — Ambulatory Visit: Payer: 59 | Admitting: *Deleted

## 2016-06-13 VITALS — Ht 64.5 in | Wt 154.0 lb

## 2016-06-13 DIAGNOSIS — Z1211 Encounter for screening for malignant neoplasm of colon: Secondary | ICD-10-CM

## 2016-06-13 MED ORDER — NA SULFATE-K SULFATE-MG SULF 17.5-3.13-1.6 GM/177ML PO SOLN: 1.0000 | Freq: Once | ORAL | 0 refills | Status: AC

## 2016-06-13 NOTE — Progress Notes (Signed)
Denies allergies to eggs or soy products. Denies complications with sedation or anesthesia. Denies O2 use. Denies use of diet or weight loss medications.  Emmi instructions given for colonoscopy.  

## 2016-06-18 ENCOUNTER — Encounter: Payer: Self-pay | Admitting: Gastroenterology

## 2016-07-02 ENCOUNTER — Encounter: Payer: Self-pay | Admitting: Gastroenterology

## 2016-07-02 ENCOUNTER — Ambulatory Visit (AMBULATORY_SURGERY_CENTER): Payer: 59 | Admitting: Gastroenterology

## 2016-07-02 VITALS — BP 96/48 | HR 62 | Temp 98.0°F | Resp 18 | Ht 64.0 in | Wt 154.0 lb

## 2016-07-02 DIAGNOSIS — D128 Benign neoplasm of rectum: Secondary | ICD-10-CM

## 2016-07-02 DIAGNOSIS — D129 Benign neoplasm of anus and anal canal: Secondary | ICD-10-CM

## 2016-07-02 DIAGNOSIS — Z1211 Encounter for screening for malignant neoplasm of colon: Secondary | ICD-10-CM

## 2016-07-02 DIAGNOSIS — Z1212 Encounter for screening for malignant neoplasm of rectum: Secondary | ICD-10-CM | POA: Diagnosis not present

## 2016-07-02 DIAGNOSIS — K621 Rectal polyp: Secondary | ICD-10-CM | POA: Diagnosis not present

## 2016-07-02 MED ORDER — SODIUM CHLORIDE 0.9 % IV SOLN
500.0000 mL | INTRAVENOUS | Status: DC
Start: 2016-07-02 — End: 2019-03-26

## 2016-07-02 NOTE — Progress Notes (Signed)
Report given to PACU RN, vss 

## 2016-07-02 NOTE — Op Note (Signed)
[facility] Patient Name: Paige Waters Procedure Date: 07/02/2016 1:22 PM MRN: [id number] Endoscopist: Mauri Pole , MD Age: 66 Referring MD:  Date of Birth: 09/01/1950 Gender: Female Account #: [id number] Procedure:                Colonoscopy Indications:              Screening for colorectal malignant neoplasm, Last                            colonoscopy: 2007 Medicines:                Monitored Anesthesia Care Procedure:                Pre-Anesthesia Assessment:                           - Prior to the procedure, a History and Physical                            was performed, and patient medications and                            allergies were reviewed. The patient's tolerance of                            previous anesthesia was also reviewed. The risks                            and benefits of the procedure and the sedation                            options and risks were discussed with the patient.                            All questions were answered, and informed consent                            was obtained. Prior Anticoagulants: The patient has                            taken no previous anticoagulant or antiplatelet                            agents. ASA Grade Assessment: II - A patient with                            mild systemic disease. After reviewing the risks                            and benefits, the patient was deemed in                            satisfactory condition to undergo the procedure.  After obtaining informed consent, the colonoscope                            was passed under direct vision. Throughout the                            procedure, the patient's blood pressure, pulse, and                            oxygen saturations were monitored continuously. The                            Model CF-HQ190L [id number]) scope was introduced                            through the anus and advanced to the  the terminal                            ileum, with identification of the appendiceal                            orifice and IC valve. The colonoscopy was performed                            without difficulty. The patient tolerated the                            procedure well. The quality of the bowel                            preparation was excellent. The terminal ileum,                            ileocecal valve, appendiceal orifice, and rectum                            were photographed. Scope In: 1:43:51 PM Scope Out: 2:09:06 PM Scope Withdrawal Time: 0 hours 14 minutes 5 seconds  Total Procedure Duration: 0 hours 25 minutes 15 seconds  Findings:                 The perianal and digital rectal examinations were                            normal.                           Two flat polyps were found in the rectum. The                            polyps were 3 to 4 mm in size. These polyps were                            removed with a cold snare. Resection and retrieval  were complete.                           Non-bleeding internal hemorrhoids were found during                            retroflexion. The hemorrhoids were small.                           The exam was otherwise without abnormality on                            direct and retroflexion views. Complications:            No immediate complications. Estimated Blood Loss:     Estimated blood loss was minimal. Impression:               - Two 3 to 4 mm polyps in the rectum, removed with                            a cold snare. Resected and retrieved.                           - Non-bleeding internal hemorrhoids.                           - The examination was otherwise normal on direct                            and retroflexion views. Recommendation:           - Patient has a contact number available for                            emergencies. The signs and symptoms of potential                             delayed complications were discussed with the                            patient. Return to normal activities tomorrow.                            Written discharge instructions were provided to the                            patient.                           - Resume previous diet.                           - Continue present medications.                           - Await pathology results.                           -  Repeat colonoscopy in 5-10 years for surveillance                            based on pathology results.                           - Return to GI clinic PRN. Mauri Pole, MD 07/02/2016 2:17:09 PM This report has been signed electronically.

## 2016-07-02 NOTE — Patient Instructions (Addendum)
YOU HAD AN ENDOSCOPIC PROCEDURE TODAY AT [facility]:   Refer to the procedure report that was given to you for any specific questions about what was found during the examination.  If the procedure report does not answer your questions, please call your gastroenterologist to clarify.  If you requested that your care partner not be given the details of your procedure findings, then the procedure report has been included in a sealed envelope for you to review at your convenience later.  YOU SHOULD EXPECT: Some feelings of bloating in the abdomen. Passage of more gas than usual.  Walking can help get rid of the air that was put into your GI tract during the procedure and reduce the bloating. If you had a lower endoscopy (such as a colonoscopy or flexible sigmoidoscopy) you may notice spotting of blood in your stool or on the toilet paper. If you underwent a bowel prep for your procedure, you may not have a normal bowel movement for a few days.  Please Note:  You might notice some irritation and congestion in your nose or some drainage.  This is from the oxygen used during your procedure.  There is no need for concern and it should clear up in a day or so.  SYMPTOMS TO REPORT IMMEDIATELY:   Following lower endoscopy (colonoscopy or flexible sigmoidoscopy):  Excessive amounts of blood in the stool  Significant tenderness or worsening of abdominal pains  Swelling of the abdomen that is new, acute  Fever of 100F or higher For urgent or emergent issues, a gastroenterologist can be reached at any hour by calling [phone].  PLease read all handouts given to you by your recovery nurse.   DIET:  We do recommend a small meal at first, but then you may proceed to your regular diet.  Drink plenty of fluids but you should avoid alcoholic beverages for 24 hours.  ACTIVITY:  You should plan to take it easy for the rest of today and you should NOT DRIVE or use heavy machinery until tomorrow  (because of the sedation medicines used during the test).    FOLLOW UP: Our staff will call the number listed on your records the next business day following your procedure to check on you and address any questions or concerns that you may have regarding the information given to you following your procedure. If we do not reach you, we will leave a message.  However, if you are feeling well and you are not experiencing any problems, there is no need to return our call.  We will assume that you have returned to your regular daily activities without incident.  If any biopsies were taken you will be contacted by phone or by letter within the next 1-3 weeks.  Please call us at [phone] if you have not heard about the biopsies in 3 weeks.    SIGNATURES/CONFIDENTIALITY: You and/or your care partner have signed paperwork which will be entered into your electronic medical record.  These signatures attest to the fact that that the information above on your After Visit Summary has been reviewed and is understood.  Full responsibility of the confidentiality of this discharge information lies with you and/or your care-partner.  Thank you for letting us take care of your healthcare needs today.

## 2016-07-03 ENCOUNTER — Telehealth: Payer: Self-pay

## 2016-07-03 NOTE — Telephone Encounter (Signed)
  Follow up Call-  Call back number 07/02/2016  Post procedure Call Back phone  # [phone]  Permission to leave phone message Yes  Some recent data might be hidden    Patient was called for follow up after her procedure on 07/02/2016. No answer at the number given for follow up phone call. A message was left on the answering machine.

## 2016-07-08 ENCOUNTER — Encounter: Payer: Self-pay | Admitting: Gastroenterology

## 2017-02-07 ENCOUNTER — Encounter: Payer: 59 | Admitting: Family Medicine

## 2017-04-07 LAB — HM MAMMOGRAPHY

## 2017-04-08 ENCOUNTER — Other Ambulatory Visit (HOSPITAL_COMMUNITY)
Admission: RE | Admit: 2017-04-08 | Discharge: 2017-04-08 | Disposition: A | Discharge status: Home / Self Care | Payer: 59 | Source: Ambulatory Visit | Attending: Family Medicine | Admitting: Family Medicine

## 2017-04-08 ENCOUNTER — Encounter: Payer: Self-pay | Admitting: Family Medicine

## 2017-04-08 ENCOUNTER — Ambulatory Visit (INDEPENDENT_AMBULATORY_CARE_PROVIDER_SITE_OTHER): Payer: 59 | Admitting: Family Medicine

## 2017-04-08 VITALS — BP 108/78 | HR 61 | Temp 97.9°F | Ht 65.0 in | Wt 154.0 lb

## 2017-04-08 DIAGNOSIS — M722 Plantar fascial fibromatosis: Secondary | ICD-10-CM | POA: Diagnosis not present

## 2017-04-08 DIAGNOSIS — E785 Hyperlipidemia, unspecified: Secondary | ICD-10-CM | POA: Diagnosis not present

## 2017-04-08 DIAGNOSIS — Z0000 Encounter for general adult medical examination without abnormal findings: Secondary | ICD-10-CM

## 2017-04-08 LAB — POC URINALSYSI DIPSTICK (AUTOMATED)
Bilirubin, UA: NEGATIVE
Blood, UA: NEGATIVE
GLUCOSE UA: NEGATIVE
Ketones, UA: NEGATIVE
Leukocytes, UA: NEGATIVE
Nitrite, UA: NEGATIVE
Protein, UA: NEGATIVE
SPEC GRAV UA: 1.02 (ref 1.010–1.025)
UROBILINOGEN UA: 0.2 U/dL
pH, UA: 6 (ref 5.0–8.0)

## 2017-04-08 NOTE — Progress Notes (Deleted)
Patient ID: Paige Waters, female    DOB: 1950-10-23  Age: 66 y.o. MRN: [id number]    Subjective:  Subjective  HPI Paige Waters presents for cpe and pap.  Pt c/o   Review of Systems  Constitutional: Negative for chills and fever.  HENT: Negative for congestion and hearing loss.   Eyes: Negative for discharge.  Respiratory: Negative for cough and shortness of breath.   Cardiovascular: Negative for chest pain, palpitations and leg swelling.  Gastrointestinal: Negative for abdominal pain, blood in stool, constipation, diarrhea, nausea and vomiting.  Genitourinary: Negative for dysuria, frequency, hematuria and urgency.  Musculoskeletal: Negative for back pain and myalgias.  Skin: Negative for rash.  Allergic/Immunologic: Negative for environmental allergies.  Neurological: Negative for dizziness, weakness and headaches.  Hematological: Does not bruise/bleed easily.  Psychiatric/Behavioral: Negative for suicidal ideas. The patient is not nervous/anxious.     History Past Medical History:  Diagnosis Date  . Colon polyps   . Hyperlipidemia   . Osteopenia   . Thyroid nodule   . Uterine hyperplasia     She has a past surgical history that includes laparoscopy and Colonoscopy (2007).   Her family history includes Colon polyps in her brother; Coronary artery disease (age of onset: 40) in her father; Heart attack in her father; Heart disease (age of onset: 34) in her father; Hyperlipidemia in her brother and father; Kidney disease in her brother; Osteoporosis in her mother.She reports that she quit smoking about 36 years ago. She has never used smokeless tobacco. She reports that she drinks alcohol. She reports that she does not use drugs.  Current Outpatient Prescriptions on File Prior to Visit  Medication Sig Dispense Refill  . Black Cohosh 40 MG CAPS Take 2 capsules by mouth daily.      . Calcium-Magnesium-Vitamin D [phone] MG-MG-UNIT TABS Take 1 tablet by mouth daily.      .  Red Yeast Rice 600 MG CAPS Take by mouth.    . TURMERIC PO Take by mouth.     Current Facility-Administered Medications on File Prior to Visit  Medication Dose Route Frequency Provider Last Rate Last Dose  . 0.9 %  sodium chloride infusion  500 mL Intravenous Continuous Nandigam, Venia Minks, MD         Objective:  Objective  Physical Exam  Constitutional: She is oriented to person, place, and time. She appears well-developed and well-nourished. No distress.  HENT:  Right Ear: External ear normal.  Left Ear: External ear normal.  Nose: Nose normal.  Mouth/Throat: Oropharynx is clear and moist.  Eyes: Pupils are equal, round, and reactive to light. EOM are normal.  Neck: Normal range of motion. Neck supple.  Cardiovascular: Normal rate, regular rhythm and normal heart sounds.   No murmur heard. Pulmonary/Chest: Effort normal and breath sounds normal. No respiratory distress. She has no wheezes. She has no rales. She exhibits no tenderness.  Neurological: She is alert and oriented to person, place, and time.  Psychiatric: She has a normal mood and affect. Her behavior is normal. Judgment and thought content normal.  Nursing note and vitals reviewed.  BP 108/78   Pulse 61   Temp 97.9 F (36.6 C) (Oral)   Ht 5\' 5"  (1.651 m)   Wt 154 lb (69.9 kg)   LMP 06/17/2010 (Within Years)   SpO2 98%   BMI 25.63 kg/m  Wt Readings from Last 3 Encounters:  04/08/17 154 lb (69.9 kg)  07/02/16 154 lb (69.9 kg)  06/13/16  154 lb (69.9 kg)     Lab Results  Component Value Date   WBC 4.1 02/08/2016   HGB 13.7 02/08/2016   HCT 40.0 02/08/2016   PLT 179.0 02/08/2016   GLUCOSE 91 02/08/2016   CHOL 222 (H) 02/08/2016   TRIG 91.0 02/08/2016   HDL 69.80 02/08/2016   LDLDIRECT 141.2 05/06/2013   LDLCALC 134 (H) 02/08/2016   ALT 14 02/08/2016   AST 15 02/08/2016   NA 142 02/08/2016   K 4.5 02/08/2016   CL 106 02/08/2016   CREATININE 0.81 02/08/2016   BUN 10 02/08/2016   CO2 30 02/08/2016     TSH 4.47 02/23/2016    No results found.   Assessment & Plan:  Plan  I am having Paige Waters maintain her Black Cohosh, Calcium-Magnesium-Vitamin D, Red Yeast Rice, and TURMERIC PO. We will continue to administer sodium chloride.  No orders of the defined types were placed in this encounter.   Problem List Items Addressed This Visit      Unprioritized   Preventative health care - Primary   Relevant Orders   Lipid panel   CBC with Differential/Platelet   Comprehensive metabolic panel   TSH   POCT Urinalysis Dipstick (Automated)    Other Visit Diagnoses    Hyperlipidemia LDL goal <100       Relevant Orders   Lipid panel   Comprehensive metabolic panel      Follow-up: Return in about 1 year (around 04/08/2018), or if symptoms worsen or fail to improve, for annual exam, fasting.  Ann Held, DO    Subjective:     Paige Waters is a 66 y.o. female and is here for a comprehensive physical exam. The patient reports problems - L heel pain --  worse first thing in am and improves as day goes on   .  Social History   Social History  . Marital status: Married    Spouse name: N/A  . Number of children: N/A  . Years of education: N/A   Occupational History  . Not on file.   Social History Main Topics  . Smoking status: Former Smoker    Quit date: 06/17/1980  . Smokeless tobacco: Never Used  . Alcohol use Yes     Comment: occas  . Drug use: No  . Sexual activity: Yes    Partners: Male   Other Topics Concern  . Not on file   Social History Narrative   rowing   Health Maintenance  Topic Date Due  . HIV Screening  06/17/1967  . MAMMOGRAM  12/17/2015  . INFLUENZA VACCINE  04/08/2018 (Originally 01/15/2017)  . TETANUS/TDAP  05/06/2017  . PAP SMEAR  02/06/2019  . COLONOSCOPY  07/02/2026  . Hepatitis C Screening  Completed    {Common ambulatory SmartLinks:19316}  Review of Systems {ros; complete:30496}   Objective:    {Exam, Complete:[id number]}     Assessment:    Healthy female exam. ***     Plan:     See After Visit Summary for Counseling Recommendations

## 2017-04-08 NOTE — Patient Instructions (Signed)
Preventive Care 40-64 Years, Female Preventive care refers to lifestyle choices and visits with your health care provider that can promote health and wellness. What does preventive care include?  A yearly physical exam. This is also called an annual well check.  Dental exams once or twice a year.  Routine eye exams. Ask your health care provider how often you should have your eyes checked.  Personal lifestyle choices, including: ? Daily care of your teeth and gums. ? Regular physical activity. ? Eating a healthy diet. ? Avoiding tobacco and drug use. ? Limiting alcohol use. ? Practicing safe sex. ? Taking low-dose aspirin daily starting at age 58. ? Taking vitamin and mineral supplements as recommended by your health care provider. What happens during an annual well check? The services and screenings done by your health care provider during your annual well check will depend on your age, overall health, lifestyle risk factors, and family history of disease. Counseling Your health care provider may ask you questions about your:  Alcohol use.  Tobacco use.  Drug use.  Emotional well-being.  Home and relationship well-being.  Sexual activity.  Eating habits.  Work and work Statistician.  Method of birth control.  Menstrual cycle.  Pregnancy history.  Screening You may have the following tests or measurements:  Height, weight, and BMI.  Blood pressure.  Lipid and cholesterol levels. These may be checked every 5 years, or more frequently if you are over 81 years old.  Skin check.  Lung cancer screening. You may have this screening every year starting at age 78 if you have a 30-pack-year history of smoking and currently smoke or have quit within the past 15 years.  Fecal occult blood test (FOBT) of the stool. You may have this test every year starting at age 65.  Flexible sigmoidoscopy or colonoscopy. You may have a sigmoidoscopy every 5 years or a colonoscopy  every 10 years starting at age 30.  Hepatitis C blood test.  Hepatitis B blood test.  Sexually transmitted disease (STD) testing.  Diabetes screening. This is done by checking your blood sugar (glucose) after you have not eaten for a while (fasting). You may have this done every 1-3 years.  Mammogram. This may be done every 1-2 years. Talk to your health care provider about when you should start having regular mammograms. This may depend on whether you have a family history of breast cancer.  BRCA-related cancer screening. This may be done if you have a family history of breast, ovarian, tubal, or peritoneal cancers.  Pelvic exam and Pap test. This may be done every 3 years starting at age 80. Starting at age 36, this may be done every 5 years if you have a Pap test in combination with an HPV test.  Bone density scan. This is done to screen for osteoporosis. You may have this scan if you are at high risk for osteoporosis.  Discuss your test results, treatment options, and if necessary, the need for more tests with your health care provider. Vaccines Your health care provider may recommend certain vaccines, such as:  Influenza vaccine. This is recommended every year.  Tetanus, diphtheria, and acellular pertussis (Tdap, Td) vaccine. You may need a Td booster every 10 years.  Varicella vaccine. You may need this if you have not been vaccinated.  Zoster vaccine. You may need this after age 5.  Measles, mumps, and rubella (MMR) vaccine. You may need at least one dose of MMR if you were born in  1957 or later. You may also need a second dose.  Pneumococcal 13-valent conjugate (PCV13) vaccine. You may need this if you have certain conditions and were not previously vaccinated.  Pneumococcal polysaccharide (PPSV23) vaccine. You may need one or two doses if you smoke cigarettes or if you have certain conditions.  Meningococcal vaccine. You may need this if you have certain  conditions.  Hepatitis A vaccine. You may need this if you have certain conditions or if you travel or work in places where you may be exposed to hepatitis A.  Hepatitis B vaccine. You may need this if you have certain conditions or if you travel or work in places where you may be exposed to hepatitis B.  Haemophilus influenzae type b (Hib) vaccine. You may need this if you have certain conditions.  Talk to your health care provider about which screenings and vaccines you need and how often you need them. This information is not intended to replace advice given to you by your health care provider. Make sure you discuss any questions you have with your health care provider. Document Released: 06/30/2015 Document Revised: 02/21/2016 Document Reviewed: 04/04/2015 Elsevier Interactive Patient Education  2017 [employer].

## 2017-04-09 DIAGNOSIS — M722 Plantar fascial fibromatosis: Secondary | ICD-10-CM | POA: Insufficient documentation

## 2017-04-09 LAB — CBC WITH DIFFERENTIAL/PLATELET
BASOS ABS: 0 10*3/uL (ref 0.0–0.1)
Basophils Relative: 0.7 % (ref 0.0–3.0)
EOS PCT: 3.1 % (ref 0.0–5.0)
Eosinophils Absolute: 0.2 10*3/uL (ref 0.0–0.7)
HEMATOCRIT: 41.8 % (ref 36.0–46.0)
Hemoglobin: 13.8 g/dL (ref 12.0–15.0)
LYMPHS PCT: 38.7 % (ref 12.0–46.0)
Lymphs Abs: 1.8 10*3/uL (ref 0.7–4.0)
MCHC: 33 g/dL (ref 30.0–36.0)
MCV: 94 fl (ref 78.0–100.0)
MONOS PCT: 6.7 % (ref 3.0–12.0)
Monocytes Absolute: 0.3 10*3/uL (ref 0.1–1.0)
NEUTROS ABS: 2.4 10*3/uL (ref 1.4–7.7)
Neutrophils Relative %: 50.8 % (ref 43.0–77.0)
Platelets: 183 10*3/uL (ref 150.0–400.0)
RBC: 4.44 Mil/uL (ref 3.87–5.11)
RDW: 13.8 % (ref 11.5–15.5)
WBC: 4.8 10*3/uL (ref 4.0–10.5)

## 2017-04-09 LAB — COMPREHENSIVE METABOLIC PANEL
ALK PHOS: 76 U/L (ref 39–117)
ALT: 17 U/L (ref 0–35)
AST: 15 U/L (ref 0–37)
Albumin: 4.4 g/dL (ref 3.5–5.2)
BILIRUBIN TOTAL: 0.4 mg/dL (ref 0.2–1.2)
BUN: 10 mg/dL (ref 6–23)
CALCIUM: 10 mg/dL (ref 8.4–10.5)
CO2: 30 mEq/L (ref 19–32)
Chloride: 104 mEq/L (ref 96–112)
Creatinine, Ser: 0.63 mg/dL (ref 0.40–1.20)
GFR: 100.86 mL/min (ref >60.00)
Glucose, Bld: 86 mg/dL (ref 70–99)
Potassium: 4.5 mEq/L (ref 3.5–5.1)
Sodium: 144 mEq/L (ref 135–145)
TOTAL PROTEIN: 7.2 g/dL (ref 6.0–8.3)

## 2017-04-09 LAB — LIPID PANEL
CHOL/HDL RATIO: 3
Cholesterol: 226 mg/dL — ABNORMAL HIGH (ref 0–200)
HDL: 74.3 mg/dL (ref >39.00)
LDL Cholesterol: 132 mg/dL — ABNORMAL HIGH (ref 0–99)
NONHDL: 151.55
TRIGLYCERIDES: 99 mg/dL (ref 0.0–149.0)
VLDL: 19.8 mg/dL (ref 0.0–40.0)

## 2017-04-09 LAB — TSH: TSH: 3.41 u[IU]/mL (ref 0.35–4.50)

## 2017-04-09 NOTE — Assessment & Plan Note (Signed)
Use frozen water bottle to ice area Stretch before getting out of bed Gel inserts  Wear tennis shoes Podiatry/ sport med if no improvement

## 2017-04-09 NOTE — Progress Notes (Signed)
Subjective:     Paige Waters is a 66 y.o. female and is here for a comprehensive physical exam. The patient reports problems - L heel pain-- worse in am and improves as day goes on , if she sits down and gets back up pain is worse again.  Social History   Social History  . Marital status: Married    Spouse name: N/A  . Number of children: N/A  . Years of education: N/A   Occupational History  . Not on file.   Social History Main Topics  . Smoking status: Former Smoker    Quit date: 06/17/1980  . Smokeless tobacco: Never Used  . Alcohol use Yes     Comment: occas  . Drug use: No  . Sexual activity: Yes    Partners: Male   Other Topics Concern  . Not on file   Social History Narrative   rowing   Health Maintenance  Topic Date Due  . HIV Screening  06/17/1967  . MAMMOGRAM  12/17/2015  . INFLUENZA VACCINE  04/08/2018 (Originally 01/15/2017)  . TETANUS/TDAP  05/06/2017  . PAP SMEAR  02/06/2019  . COLONOSCOPY  07/02/2026  . Hepatitis C Screening  Completed    The following portions of the patient's history were reviewed and updated as appropriate:  She  has a past medical history of Colon polyps; Hyperlipidemia; Osteopenia; Thyroid nodule; and Uterine hyperplasia. She  does not have any pertinent problems on file. She  has a past surgical history that includes laparoscopy and Colonoscopy (2007). Her family history includes Colon polyps in her brother; Coronary artery disease (age of onset: 70) in her father; Heart attack in her father; Heart disease (age of onset: 12) in her father; Hyperlipidemia in her brother and father; Kidney disease in her brother; Osteoporosis in her mother. She  reports that she quit smoking about 36 years ago. She has never used smokeless tobacco. She reports that she drinks alcohol. She reports that she does not use drugs. She has a current medication list which includes the following prescription(s): black cohosh, calcium-magnesium-vitamin d, red  yeast rice, and turmeric, and the following Facility-Administered Medications: sodium chloride. Current Outpatient Prescriptions on File Prior to Visit  Medication Sig Dispense Refill  . Black Cohosh 40 MG CAPS Take 2 capsules by mouth daily.      . Calcium-Magnesium-Vitamin D [phone] MG-MG-UNIT TABS Take 1 tablet by mouth daily.      . Red Yeast Rice 600 MG CAPS Take by mouth.    . TURMERIC PO Take by mouth.     Current Facility-Administered Medications on File Prior to Visit  Medication Dose Route Frequency Provider Last Rate Last Dose  . 0.9 %  sodium chloride infusion  500 mL Intravenous Continuous Nandigam, Venia Minks, MD       She has No Known Allergies..  Review of Systems Review of Systems  Constitutional: Negative for activity change, appetite change and fatigue.  HENT: Negative for hearing loss, congestion, tinnitus and ear discharge.  dentist q16m Eyes: Negative for visual disturbance (see optho q1y -- vision corrected to 20/20 with glasses).  Respiratory: Negative for cough, chest tightness and shortness of breath.   Cardiovascular: Negative for chest pain, palpitations and leg swelling.  Gastrointestinal: Negative for abdominal pain, diarrhea, constipation and abdominal distention.  Genitourinary: Negative for urgency, frequency, decreased urine volume and difficulty urinating.  Musculoskeletal: Negative for back pain, arthralgias and gait problem.  Skin: Negative for color change, pallor and rash.  Neurological: Negative for dizziness, light-headedness, numbness and headaches.  Hematological: Negative for adenopathy. Does not bruise/bleed easily.  Psychiatric/Behavioral: Negative for suicidal ideas, confusion, sleep disturbance, self-injury, dysphoric mood, decreased concentration and agitation.        Objective:    BP 108/78   Pulse 61   Temp 97.9 F (36.6 C) (Oral)   Ht 5\' 5"  (1.651 m)   Wt 154 lb (69.9 kg)   LMP 06/17/2010 (Within Years)   SpO2 98%   BMI  25.63 kg/m  General appearance: alert, cooperative, appears stated age and no distress Head: Normocephalic, without obvious abnormality, atraumatic Eyes: conjunctivae/corneas clear. PERRL, EOM's intact. Fundi benign. Ears: normal TM's and external ear canals both ears Nose: Nares normal. Septum midline. Mucosa normal. No drainage or sinus tenderness. Throat: lips, mucosa, and tongue normal; teeth and gums normal Neck: no adenopathy, no carotid bruit, no JVD, supple, symmetrical, trachea midline and thyroid not enlarged, symmetric, no tenderness/mass/nodules Back: symmetric, no curvature. ROM normal. No CVA tenderness. Lungs: clear to auscultation bilaterally Breasts: normal appearance, no masses or tenderness Heart: regular rate and rhythm, S1, S2 normal, no murmur, click, rub or gallop Abdomen: soft, non-tender; bowel sounds normal; no masses,  no organomegaly Pelvic: cervix normal in appearance, external genitalia normal, no adnexal masses or tenderness, no cervical motion tenderness, rectovaginal septum normal, uterus normal size, shape, and consistency, vagina normal without discharge and pap done,  rectal heme neg brown stool Extremities: extremities normal, atraumatic, no cyanosis or edema Pulses: 2+ and symmetric Skin: Skin color, texture, turgor normal. No rashes or lesions Lymph nodes: Cervical, supraclavicular, and axillary nodes normal. Neurologic: Alert and oriented X 3, normal strength and tone. Normal symmetric reflexes. Normal coordination and gait    Assessment:    Healthy female exam.      Plan:    ghm utd Check labs  See After Visit Summary for Counseling Recommendations    1. Preventative health care See above - Lipid panel - CBC with Differential/Platelet - Comprehensive metabolic panel - TSH - POCT Urinalysis Dipstick (Automated) - Cytology - PAP  2. Hyperlipidemia LDL goal <100 Encouraged heart healthy diet, increase exercise, avoid trans fats, consider  a krill oil cap daily  - Lipid panel - Comprehensive metabolic panel  3. Plantar fasciitis Frozen water bottle to ice area Stretch in am before getting out of bed Gel inserts Wear tennis shoes  Podiatry / sport med if no improvement

## 2017-04-11 LAB — CYTOLOGY - PAP
DIAGNOSIS: NEGATIVE
HPV: NOT DETECTED

## 2017-04-25 ENCOUNTER — Encounter: Payer: Self-pay | Admitting: Family Medicine

## 2017-11-21 ENCOUNTER — Ambulatory Visit (INDEPENDENT_AMBULATORY_CARE_PROVIDER_SITE_OTHER): Payer: 59 | Admitting: Family Medicine

## 2017-11-21 ENCOUNTER — Encounter: Payer: Self-pay | Admitting: Family Medicine

## 2017-11-21 VITALS — BP 112/82 | HR 61 | Temp 98.1°F | Ht 64.5 in | Wt 149.0 lb

## 2017-11-21 DIAGNOSIS — S46811A Strain of other muscles, fascia and tendons at shoulder and upper arm level, right arm, initial encounter: Secondary | ICD-10-CM

## 2017-11-21 NOTE — Progress Notes (Signed)
Pre visit review using our clinic review tool, if applicable. No additional management support is needed unless otherwise documented below in the visit note. 

## 2017-11-21 NOTE — Progress Notes (Signed)
Musculoskeletal Exam  Patient: Paige Waters DOB: 19-Nov-1950  DOS: 11/21/2017  SUBJECTIVE:  Chief Complaint:   Chief Complaint  Patient presents with  . Neck Pain  . Arm Pain    Paige Waters is a 67 y.o.  female for evaluation and treatment of neck/shoulder pain.   Onset:  5 days ago.  No inj or change in activity.  Location: R side of neck radiating down R arm Character:  aching  Progression of issue:  is unchanged Associated symptoms: decreased ROM in neck Treatment: to date has been none.   Neurovascular symptoms: no  ROS: Musculoskeletal/Extremities: +neck pain  Past Medical History:  Diagnosis Date  . Colon polyps   . Hyperlipidemia   . Osteopenia   . Thyroid nodule   . Uterine hyperplasia     Objective: VITAL SIGNS: BP 112/82 (BP Location: Left Arm, Patient Position: Sitting, Cuff Size: Normal)   Pulse 61   Temp 98.1 F (36.7 C) (Oral)   Ht 5' 4.5" (1.638 m)   Wt 149 lb (67.6 kg)   LMP 06/17/2010 (Within Years)   SpO2 97%   BMI 25.18 kg/m  Constitutional: Well formed, well developed. No acute distress. Cardiovascular: Brisk cap refill Thorax & Lungs: No accessory muscle use Musculoskeletal: R shoulder.   Normal active range of motion: yes.   Normal passive range of motion: yes Tenderness to palpation: yes, over R trap and shoulder Deformity: no Ecchymosis: no Tests positive: none Tests negative: Spurling's, cross over, hawkins, neer's, empty can, speed's, lift off Neurologic: Normal sensory function. No focal deficits noted. DTR's equal and symmetry in UE's. No clonus. Psychiatric: Normal mood. Age appropriate judgment and insight. Alert & oriented x 3.    Assessment:  Trapezius strain, right, initial encounter  Plan: Stretches/exercises, heat, ibuprofen, Tylenol.  F/u prn. The patient voiced understanding and agreement to the plan.   [place], DO 11/21/17  1:37 PM

## 2017-11-21 NOTE — Patient Instructions (Signed)
Heat (pad or rice pillow in microwave) over affected area, 10-15 minutes twice daily.  ° °OK to take Tylenol 1000 mg (2 extra strength tabs) or 975 mg (3 regular strength tabs) every 6 hours as needed. ° °Ibuprofen 400-600 mg (2-3 over the counter strength tabs) every 6 hours as needed for pain. ° °Trapezius stretches/exercises °Do exercises exactly as told by your health care provider and adjust them as directed. It is normal to feel mild stretching, pulling, tightness, or discomfort as you do these exercises, but you should stop right away if you feel sudden pain or your pain gets worse.  ° °Stretching and range of motion exercises °These exercises warm up your muscles and joints and improve the movement and flexibility of your shoulder. These exercises can also help to relieve pain, numbness, and tingling. If you are unable to do any of the following for any reason, do not further attempt to do it.  ° °Exercise A: Flexion, standing  ° °  °1. Stand and hold a broomstick, a cane, or a similar object. Place your hands a little more than shoulder-width apart on the object. Your left / right hand should be palm-up, and your other hand should be palm-down. °2. Push the stick to raise your left / right arm out to your side and then over your head. Use your other hand to help move the stick. Stop when you feel a stretch in your shoulder, or when you reach the angle that is recommended by your health care provider. °? Avoid shrugging your shoulder while you raise your arm. Keep your shoulder blade tucked down toward your spine. °3. Hold for 30 seconds. °4. Slowly return to the starting position. °Repeat 2 times. Complete this exercise 3 times per week. ° °Exercise B: Abduction, supine  ° °  °1. Lie on your back and hold a broomstick, a cane, or a similar object. Place your hands a little more than shoulder-width apart on the object. Your left / right hand should be palm-up, and your other hand should be  palm-down. °2. Push the stick to raise your left / right arm out to your side and then over your head. Use your other hand to help move the stick. Stop when you feel a stretch in your shoulder, or when you reach the angle that is recommended by your health care provider. °? Avoid shrugging your shoulder while you raise your arm. Keep your shoulder blade tucked down toward your spine. °3. Hold for 30 seconds. °4. Slowly return to the starting position. °Repeat 2 times. Complete this exercise 3 times per week. ° °Exercise C: Flexion, active-assisted  ° °  °1. Lie on your back. You may bend your knees for comfort. °2. Hold a broomstick, a cane, or a similar object. Place your hands about shoulder-width apart on the object. Your palms should face toward your feet. °3. Raise the stick and move your arms over your head and behind your head, toward the floor. Use your healthy arm to help your left / right arm move farther. Stop when you feel a gentle stretch in your shoulder, or when you reach the angle where your health care provider tells you to stop. °4. Hold for 30 seconds. °5. Slowly return to the starting position. °Repeat 2 times. Complete this exercise 3 times per week. ° °Exercise D: External rotation and abduction  ° °  °1. Stand in a door frame with one of your feet slightly in front of the other. This is called a   staggered stance. °2. Choose one of the following positions as told by your health care provider: °? Place your hands and forearms on the door frame above your head. °? Place your hands and forearms on the door frame at the height of your head. °? Place your hands on the door frame at the height of your elbows. °3. Slowly move your weight onto your front foot until you feel a stretch across your chest and in the front of your shoulders. Keep your head and chest upright and keep your abdominal muscles tight. °4. Hold for 30 seconds. °5. To release the stretch, shift your weight to your back  foot. °Repeat 2 times. Complete this stretch 3 times per week. ° °Strengthening exercises °These exercises build strength and endurance in your shoulder. Endurance is the ability to use your muscles for a long time, even after your muscles get tired. °Exercise E: Scapular depression and adduction  °1. Sit on a stable chair. Support your arms in front of you with pillows, armrests, or a tabletop. Keep your elbows in line with the sides of your body. °2. Gently move your shoulder blades down toward your middle back. Relax the muscles on the tops of your shoulders and in the back of your neck. °3. Hold for 3 seconds. °4. Slowly release the tension and relax your muscles completely before doing this exercise again. Repeat for a total of 10 repetitions. °5. After you have practiced this exercise, try doing the exercise without the arm support. Then, try the exercise while standing instead of sitting. °Repeat 2 times. Complete this exercise 3 times per week. ° °Exercise F: Shoulder abduction, isometric  ° °  °1. Stand or sit about 4-6 inches (10-15 cm) from a wall with your left / right side facing the wall. °2. Bend your left / right elbow and gently press your elbow against the wall. °3. Increase the pressure slowly until you are pressing as hard as you can without shrugging your shoulder. °4. Hold for 3 seconds. °5. Slowly release the tension and relax your muscles completely. Repeat for a total of 10 repetitions. °Repeat 2 times. Complete this exercise 3 times per week. ° °Exercise G: Shoulder flexion, isometric  ° °  °1. Stand or sit about 4-6 inches (10-15 cm) away from a wall with your left / right side facing the wall. °2. Keep your left / right elbow straight and gently press the top of your fist against the wall. Increase the pressure slowly until you are pressing as hard as you can without shrugging your shoulder. °3. Hold for 10-15 seconds. °4. Slowly release the tension and relax your muscles completely.  Repeat for a total of 10 repetitions. °Repeat 2 times. Complete this exercise 3 times per week. ° °Exercise H: Internal rotation  ° °  °1. Sit in a stable chair without armrests, or stand. Secure an exercise band at your left / right side, at elbow height. °2. Place a soft object, such as a folded towel or a small pillow, under your left / right upper arm so your elbow is a few inches (about 8 cm) away from your side. °3. Hold the end of the exercise band so the band stretches. °4. Keeping your elbow pressed against the soft object under your arm, move your forearm across your body toward your abdomen. Keep your body steady so the movement is only coming from your shoulder. °5. Hold for 3 seconds. °6. Slowly return to the starting position. Repeat for a   total of 10 repetitions. °Repeat 2 times. Complete this exercise 3 times per week. ° °Exercise I: External rotation  ° °  °1. Sit in a stable chair without armrests, or stand. °2. Secure an exercise band at your left / right side, at elbow height. °3. Place a soft object, such as a folded towel or a small pillow, under your left / right upper arm so your elbow is a few inches (about 8 cm) away from your side. °4. Hold the end of the exercise band so the band stretches. °5. Keeping your elbow pressed against the soft object under your arm, move your forearm out, away from your abdomen. Keep your body steady so the movement is only coming from your shoulder. °6. Hold for 3 seconds. °7. Slowly return to the starting position. Repeat for a total of 10 repetitions. °Repeat 2 times. Complete this exercise 3 times per week. °Exercise J: Shoulder extension  °1. Sit in a stable chair without armrests, or stand. Secure an exercise band to a stable object in front of you so the band is at shoulder height. °2. Hold one end of the exercise band in each hand. Your palms should face each other. °3. Straighten your elbows and lift your hands up to shoulder height. °4. Step back,  away from the secured end of the exercise band, until the band stretches. °5. Squeeze your shoulder blades together and pull your hands down to the sides of your thighs. Stop when your hands are straight down by your sides. Do not let your hands go behind your body. °6. Hold for 3 seconds. °7. Slowly return to the starting position. Repeat for a total of 10 repetitions. °Repeat 2 times. Complete this exercise 3 times per week. ° °Exercise K: Shoulder extension, prone  ° °  °1. Lie on your abdomen on a firm surface so your left / right arm hangs over the edge. °2. Hold a 5 lb weight in your hand so your palm faces in toward your body. Your arm should be straight. °3. Squeeze your shoulder blade down toward the middle of your back. °4. Slowly raise your arm behind you, up to the height of the surface that you are lying on. Keep your arm straight. °5. Hold for 3 seconds. °6. Slowly return to the starting position and relax your muscles. Repeat for a total of 10 repetitions. °Repeat 2 times. Complete this exercise 3 times per week.  ° °Exercise L: Horizontal abduction, prone  °1. Lie on your abdomen on a firm surface so your left / right arm hangs over the edge. °2. Hold a 5 lb weight in your hand so your palm faces toward your feet. Your arm should be straight. °3. Squeeze your shoulder blade down toward the middle of your back. °4. Bend your elbow so your hand moves up, until your elbow is bent to an "L" shape (90 degrees). With your elbow bent, slowly move your forearm forward and up. Raise your hand up to the height of the surface that you are lying on. °? Your upper arm should not move, and your elbow should stay bent. °? At the top of the movement, your palm should face the floor. °5. Hold for 3 seconds. °6. Slowly return to the starting position and relax your muscles. Repeat for a total of 10 repetitions. °Repeat 2 times. Complete this exercise 3 times per week. ° °Exercise M: Horizontal abduction, standing   °1. Sit on a stable chair, or stand. °2.   Secure an exercise band to a stable object in front of you so the band is at shoulder height. °3. Hold one end of the exercise band in each hand. °4. Straighten your elbows and lift your hands straight in front of you, up to shoulder height. Your palms should face down, toward the floor. °5. Step back, away from the secured end of the exercise band, until the band stretches. °6. Move your arms out to your sides, and keep your arms straight. °7. Hold for 3 seconds. °8. Slowly return to the starting position. Repeat for a total of 10 repetitions. °Repeat 2 times. Complete this exercise 3 times per week. ° °Exercise N: Scapular retraction and elevation  °1. Sit on a stable chair, or stand. °2. Secure an exercise band to a stable object in front of you so the band is at shoulder height. °3. Hold one end of the exercise band in each hand. Your palms should face each other. °4. Sit in a stable chair without armrests, or stand. °5. Step back, away from the secured end of the exercise band, until the band stretches. °6. Squeeze your shoulder blades together and lift your hands over your head. Keep your elbows straight. °7. Hold for 3 seconds. °8. Slowly return to the starting position. Repeat for a total of 10 repetitions. °Repeat 2 times. Complete this exercise 3 times per week. ° °This information is not intended to replace advice given to you by your health care provider. Make sure you discuss any questions you have with your health care provider. °Document Released: 06/03/2005 Document Revised: 02/08/2016 Document Reviewed: 04/20/2015 °Elsevier Interactive Patient Education © 2017 [employer] ° °

## 2018-04-16 ENCOUNTER — Encounter: Payer: Self-pay | Admitting: Family Medicine

## 2018-04-16 ENCOUNTER — Ambulatory Visit (INDEPENDENT_AMBULATORY_CARE_PROVIDER_SITE_OTHER): Payer: 59 | Admitting: Family Medicine

## 2018-04-16 VITALS — BP 117/58 | HR 65 | Temp 98.7°F | Resp 16 | Ht 64.5 in | Wt 150.0 lb

## 2018-04-16 DIAGNOSIS — Z23 Encounter for immunization: Secondary | ICD-10-CM | POA: Diagnosis not present

## 2018-04-16 DIAGNOSIS — M62838 Other muscle spasm: Secondary | ICD-10-CM

## 2018-04-16 DIAGNOSIS — Z0000 Encounter for general adult medical examination without abnormal findings: Secondary | ICD-10-CM

## 2018-04-16 LAB — LIPID PANEL
CHOLESTEROL: 219 mg/dL — AB (ref 0–200)
HDL: 70.6 mg/dL (ref >39.00)
LDL CALC: 128 mg/dL — AB (ref 0–99)
NonHDL: 148.09
Total CHOL/HDL Ratio: 3
Triglycerides: 99 mg/dL (ref 0.0–149.0)
VLDL: 19.8 mg/dL (ref 0.0–40.0)

## 2018-04-16 LAB — COMPREHENSIVE METABOLIC PANEL
ALBUMIN: 4.3 g/dL (ref 3.5–5.2)
ALK PHOS: 78 U/L (ref 39–117)
ALT: 17 U/L (ref 0–35)
AST: 16 U/L (ref 0–37)
BUN: 9 mg/dL (ref 6–23)
CALCIUM: 9.6 mg/dL (ref 8.4–10.5)
CO2: 31 mEq/L (ref 19–32)
Chloride: 104 mEq/L (ref 96–112)
Creatinine, Ser: 0.71 mg/dL (ref 0.40–1.20)
GFR: 87.58 mL/min (ref >60.00)
Glucose, Bld: 91 mg/dL (ref 70–99)
POTASSIUM: 4.9 meq/L (ref 3.5–5.1)
SODIUM: 140 meq/L (ref 135–145)
TOTAL PROTEIN: 6.5 g/dL (ref 6.0–8.3)
Total Bilirubin: 0.5 mg/dL (ref 0.2–1.2)

## 2018-04-16 LAB — CBC WITH DIFFERENTIAL/PLATELET
BASOS PCT: 0.9 % (ref 0.0–3.0)
Basophils Absolute: 0 10*3/uL (ref 0.0–0.1)
EOS ABS: 0.1 10*3/uL (ref 0.0–0.7)
Eosinophils Relative: 2.9 % (ref 0.0–5.0)
HCT: 41.8 % (ref 36.0–46.0)
HEMOGLOBIN: 14.1 g/dL (ref 12.0–15.0)
Lymphocytes Relative: 31.3 % (ref 12.0–46.0)
Lymphs Abs: 1.3 10*3/uL (ref 0.7–4.0)
MCHC: 33.6 g/dL (ref 30.0–36.0)
MCV: 91.6 fl (ref 78.0–100.0)
Monocytes Absolute: 0.3 10*3/uL (ref 0.1–1.0)
Monocytes Relative: 6.6 % (ref 3.0–12.0)
NEUTROS ABS: 2.5 10*3/uL (ref 1.4–7.7)
Neutrophils Relative %: 58.3 % (ref 43.0–77.0)
Platelets: 182 10*3/uL (ref 150.0–400.0)
RBC: 4.57 Mil/uL (ref 3.87–5.11)
RDW: 13.6 % (ref 11.5–15.5)
WBC: 4.2 10*3/uL (ref 4.0–10.5)

## 2018-04-16 LAB — TSH: TSH: 3.87 u[IU]/mL (ref 0.35–4.50)

## 2018-04-16 MED ORDER — METHOCARBAMOL 500 MG PO TABS: 500.0000 mg | ORAL_TABLET | Freq: Four times a day (QID) | ORAL | 0 refills | Status: DC

## 2018-04-16 NOTE — Patient Instructions (Signed)
Preventive Care 40-64 Years, Female Preventive care refers to lifestyle choices and visits with your health care provider that can promote health and wellness. What does preventive care include?  A yearly physical exam. This is also called an annual well check.  Dental exams once or twice a year.  Routine eye exams. Ask your health care provider how often you should have your eyes checked.  Personal lifestyle choices, including: ? Daily care of your teeth and gums. ? Regular physical activity. ? Eating a healthy diet. ? Avoiding tobacco and drug use. ? Limiting alcohol use. ? Practicing safe sex. ? Taking low-dose aspirin daily starting at age 58. ? Taking vitamin and mineral supplements as recommended by your health care provider. What happens during an annual well check? The services and screenings done by your health care provider during your annual well check will depend on your age, overall health, lifestyle risk factors, and family history of disease. Counseling Your health care provider may ask you questions about your:  Alcohol use.  Tobacco use.  Drug use.  Emotional well-being.  Home and relationship well-being.  Sexual activity.  Eating habits.  Work and work Statistician.  Method of birth control.  Menstrual cycle.  Pregnancy history.  Screening You may have the following tests or measurements:  Height, weight, and BMI.  Blood pressure.  Lipid and cholesterol levels. These may be checked every 5 years, or more frequently if you are over 81 years old.  Skin check.  Lung cancer screening. You may have this screening every year starting at age 78 if you have a 30-pack-year history of smoking and currently smoke or have quit within the past 15 years.  Fecal occult blood test (FOBT) of the stool. You may have this test every year starting at age 65.  Flexible sigmoidoscopy or colonoscopy. You may have a sigmoidoscopy every 5 years or a colonoscopy  every 10 years starting at age 30.  Hepatitis C blood test.  Hepatitis B blood test.  Sexually transmitted disease (STD) testing.  Diabetes screening. This is done by checking your blood sugar (glucose) after you have not eaten for a while (fasting). You may have this done every 1-3 years.  Mammogram. This may be done every 1-2 years. Talk to your health care provider about when you should start having regular mammograms. This may depend on whether you have a family history of breast cancer.  BRCA-related cancer screening. This may be done if you have a family history of breast, ovarian, tubal, or peritoneal cancers.  Pelvic exam and Pap test. This may be done every 3 years starting at age 80. Starting at age 36, this may be done every 5 years if you have a Pap test in combination with an HPV test.  Bone density scan. This is done to screen for osteoporosis. You may have this scan if you are at high risk for osteoporosis.  Discuss your test results, treatment options, and if necessary, the need for more tests with your health care provider. Vaccines Your health care provider may recommend certain vaccines, such as:  Influenza vaccine. This is recommended every year.  Tetanus, diphtheria, and acellular pertussis (Tdap, Td) vaccine. You may need a Td booster every 10 years.  Varicella vaccine. You may need this if you have not been vaccinated.  Zoster vaccine. You may need this after age 5.  Measles, mumps, and rubella (MMR) vaccine. You may need at least one dose of MMR if you were born in  1956 or later. You may also need a second dose.  Pneumococcal 13-valent conjugate (PCV13) vaccine. You may need this if you have certain conditions and were not previously vaccinated.  Pneumococcal polysaccharide (PPSV23) vaccine. You may need one or two doses if you smoke cigarettes or if you have certain conditions.  Meningococcal vaccine. You may need this if you have certain  conditions.  Hepatitis A vaccine. You may need this if you have certain conditions or if you travel or work in places where you may be exposed to hepatitis A.  Hepatitis B vaccine. You may need this if you have certain conditions or if you travel or work in places where you may be exposed to hepatitis B.  Haemophilus influenzae type b (Hib) vaccine. You may need this if you have certain conditions.  Talk to your health care provider about which screenings and vaccines you need and how often you need them. This information is not intended to replace advice given to you by your health care provider. Make sure you discuss any questions you have with your health care provider. Document Released: 06/30/2015 Document Revised: 02/21/2016 Document Reviewed: 04/04/2015 Elsevier Interactive Patient Education  2018 [employer] Inc.  

## 2018-04-16 NOTE — Progress Notes (Signed)
Subjective:     Paige Waters is a 67 y.o. female and is here for a comprehensive physical exam. The patient reports no problems.  Social History   Socioeconomic History  . Marital status: Married    Spouse name: Not on file  . Number of children: Not on file  . Years of education: Not on file  . Highest education level: Not on file  Occupational History  . Not on file  Social Needs  . Financial resource strain: Not on file  . Food insecurity:    Worry: Not on file    Inability: Not on file  . Transportation needs:    Medical: Not on file    Non-medical: Not on file  Tobacco Use  . Smoking status: Former Smoker    Last attempt to quit: 06/17/1980    Years since quitting: 37.8  . Smokeless tobacco: Never Used  Substance and Sexual Activity  . Alcohol use: Yes    Comment: occas  . Drug use: No  . Sexual activity: Yes    Partners: Male  Lifestyle  . Physical activity:    Days per week: Not on file    Minutes per session: Not on file  . Stress: Not on file  Relationships  . Social connections:    Talks on phone: Not on file    Gets together: Not on file    Attends religious service: Not on file    Active member of club or organization: Not on file    Attends meetings of clubs or organizations: Not on file    Relationship status: Not on file  . Intimate partner violence:    Fear of current or ex partner: Not on file    Emotionally abused: Not on file    Physically abused: Not on file    Forced sexual activity: Not on file  Other Topics Concern  . Not on file  Social History Narrative   rowing   Health Maintenance  Topic Date Due  . HIV Screening  06/17/1967  . TETANUS/TDAP  05/06/2017  . PNA vac Low Risk Adult (1 of 2 - PCV13) 06/16/2017  . MAMMOGRAM  04/07/2018  . INFLUENZA VACCINE  05/12/2019 (Originally 01/15/2018)  . PAP SMEAR  04/08/2020  . COLONOSCOPY  07/02/2026  . DEXA SCAN  Completed  . Hepatitis C Screening  Completed    The following portions of  the patient's history were reviewed and updated as appropriate:  She  has a past medical history of Colon polyps, Hyperlipidemia, Osteopenia, Thyroid nodule, and Uterine hyperplasia. She does not have any pertinent problems on file. She  has a past surgical history that includes laparoscopy and Colonoscopy (2007). Her family history includes Colon polyps in her brother; Coronary artery disease (age of onset: 24) in her father; Heart attack in her father; Heart disease (age of onset: 27) in her father; Hyperlipidemia in her brother and father; Kidney disease in her brother; Osteoporosis in her mother. She  reports that she quit smoking about 37 years ago. She has never used smokeless tobacco. She reports that she drinks alcohol. She reports that she does not use drugs. She has a current medication list which includes the following prescription(s): black cohosh, calcium-magnesium-vitamin d, co-enzyme q-10, red yeast rice, turmeric, and methocarbamol, and the following Facility-Administered Medications: sodium chloride. Current Outpatient Medications on File Prior to Visit  Medication Sig Dispense Refill  . Black Cohosh 40 MG CAPS Take 2 capsules by mouth daily.      Marland Kitchen  Calcium-Magnesium-Vitamin D [id number] MG-MG-UNIT TABS Take 1 tablet by mouth daily.      Marland Kitchen co-enzyme Q-10 30 MG capsule Take 30 mg by mouth 3 (three) times daily.    . Red Yeast Rice 600 MG CAPS Take by mouth.    . TURMERIC PO Take by mouth.     Current Facility-Administered Medications on File Prior to Visit  Medication Dose Route Frequency Provider Last Rate Last Dose  . 0.9 %  sodium chloride infusion  500 mL Intravenous Continuous Nandigam, Venia Minks, MD       She has No Known Allergies..  Review of Systems Review of Systems  Constitutional: Negative for activity change, appetite change and fatigue.  HENT: Negative for hearing loss, congestion, tinnitus and ear discharge.  dentist q24m Eyes: Negative for visual disturbance  (see optho q1y -- vision corrected to 20/20 with glasses).  Respiratory: Negative for cough, chest tightness and shortness of breath.   Cardiovascular: Negative for chest pain, palpitations and leg swelling.  Gastrointestinal: Negative for abdominal pain, diarrhea, constipation and abdominal distention.  Genitourinary: Negative for urgency, frequency, decreased urine volume and difficulty urinating.  Musculoskeletal: Negative for back pain, arthralgias and gait problem.  Skin: Negative for color change, pallor and rash.  Neurological: Negative for dizziness, light-headedness, numbness and headaches.  Hematological: Negative for adenopathy. Does not bruise/bleed easily.  Psychiatric/Behavioral: Negative for suicidal ideas, confusion, sleep disturbance, self-injury, dysphoric mood, decreased concentration and agitation.       Objective:    BP (!) 117/58 (BP Location: Left Arm, Patient Position: Sitting, Cuff Size: Small)   Pulse 65   Temp 98.7 F (37.1 C) (Oral)   Resp 16   Ht 5' 4.5" (1.638 m)   Wt 150 lb (68 kg)   LMP 06/17/2010 (Within Years)   SpO2 100%   BMI 25.35 kg/m  General appearance: alert, cooperative, appears stated age and no distress Head: Normocephalic, without obvious abnormality, atraumatic Eyes: conjunctivae/corneas clear. PERRL, EOM's intact. Fundi benign. Ears: normal TM's and external ear canals both ears Nose: Nares normal. Septum midline. Mucosa normal. No drainage or sinus tenderness. Throat: lips, mucosa, and tongue normal; teeth and gums normal Neck: no adenopathy, no carotid bruit, no JVD, supple, symmetrical, trachea midline and thyroid not enlarged, symmetric, no tenderness/mass/nodules Back: symmetric, no curvature. ROM normal. No CVA tenderness. Lungs: clear to auscultation bilaterally Breasts: normal appearance, no masses or tenderness Heart: regular rate and rhythm, S1, S2 normal, no murmur, click, rub or gallop Abdomen: soft, non-tender; bowel  sounds normal; no masses,  no organomegaly Pelvic: not indicated; post-menopausal, no abnormal Pap smears in past Extremities: extremities normal, atraumatic, no cyanosis or edema Pulses: 2+ and symmetric Skin: Skin color, texture, turgor normal. No rashes or lesions Lymph nodes: Cervical, supraclavicular, and axillary nodes normal. Neurologic: Alert and oriented X 3, normal strength and tone. Normal symmetric reflexes. Normal coordination and gait  Assessment:    Healthy female exam.      Plan:    ghm utd Check labs See After Visit Summary for Counseling Recommendations    tdap given Pt refused flu shot  She will rto for shingrix

## 2018-04-29 LAB — HM MAMMOGRAPHY

## 2018-05-21 ENCOUNTER — Telehealth: Payer: Self-pay | Admitting: *Deleted

## 2018-05-21 NOTE — Telephone Encounter (Signed)
Received Mammogram results from [facility]; forwarded to provider/SL[facility] 12/05

## 2018-06-04 ENCOUNTER — Telehealth: Payer: Self-pay

## 2018-06-04 NOTE — Telephone Encounter (Signed)
Copied from [place] [phone]. Topic: General - Other >> Jun 04, 2018 10:33 AM Oneta Rack wrote: Relation to pt: self  Call back number: [phone]   Reason for call:  Patient inquiring if the shingle vaccination is in stock, patient states PCP recommended receiving vaccination prior to the new year. Patient would like to schedule nurse visit today.  Informed patient to please call insurance to verify coverage, patient voice understanding and would like a follow up call today.

## 2018-06-08 NOTE — Telephone Encounter (Signed)
Pt is calling back to say that she checked with her insurance and it is covered at 100% and patient would like a call back asap so that she can get it scheduled and done before end of the year due to insurance. Please call pt

## 2018-06-09 NOTE — Telephone Encounter (Signed)
Pt scheduled 06/11/18 @ 10am

## 2018-06-09 NOTE — Telephone Encounter (Signed)
Can you call to get patient on the nurse schedule?

## 2018-06-11 ENCOUNTER — Ambulatory Visit (INDEPENDENT_AMBULATORY_CARE_PROVIDER_SITE_OTHER): Payer: 59

## 2018-06-11 DIAGNOSIS — Z23 Encounter for immunization: Secondary | ICD-10-CM

## 2018-06-16 ENCOUNTER — Encounter: Payer: Self-pay | Admitting: *Deleted

## 2018-08-13 ENCOUNTER — Ambulatory Visit (INDEPENDENT_AMBULATORY_CARE_PROVIDER_SITE_OTHER): Payer: Medicare Other | Admitting: *Deleted

## 2018-08-13 DIAGNOSIS — Z23 Encounter for immunization: Secondary | ICD-10-CM | POA: Diagnosis not present

## 2019-03-26 ENCOUNTER — Other Ambulatory Visit: Payer: Self-pay | Admitting: Family Medicine

## 2019-03-26 ENCOUNTER — Ambulatory Visit (INDEPENDENT_AMBULATORY_CARE_PROVIDER_SITE_OTHER): Payer: Medicare Other | Admitting: Family Medicine

## 2019-03-26 ENCOUNTER — Other Ambulatory Visit: Payer: Self-pay

## 2019-03-26 ENCOUNTER — Encounter: Payer: Self-pay | Admitting: Family Medicine

## 2019-03-26 VITALS — BP 120/84 | HR 93 | Temp 97.7°F | Ht 65.0 in | Wt 151.5 lb

## 2019-03-26 DIAGNOSIS — L03116 Cellulitis of left lower limb: Secondary | ICD-10-CM

## 2019-03-26 DIAGNOSIS — C44729 Squamous cell carcinoma of skin of left lower limb, including hip: Secondary | ICD-10-CM | POA: Diagnosis not present

## 2019-03-26 DIAGNOSIS — D489 Neoplasm of uncertain behavior, unspecified: Secondary | ICD-10-CM

## 2019-03-26 MED ORDER — CEPHALEXIN 500 MG PO CAPS: 500.0000 mg | ORAL_CAPSULE | Freq: Three times a day (TID) | ORAL | 0 refills | Status: DC

## 2019-03-26 NOTE — Progress Notes (Signed)
Chief Complaint  Patient presents with  . Cyst    left leg    Paige Waters is a 68 y.o. female here for a skin complaint.  Duration: 1 month Location: Left leg Pruritic? No Painful? No Drainage? No New soaps/lotions/topicals/detergents? No Other associated symptoms: It is growing Therapies tried thus far: none  ROS:  Const: No fevers Skin: As noted in HPI  Past Medical History:  Diagnosis Date  . Colon polyps   . Hyperlipidemia   . Osteopenia   . Thyroid nodule   . Uterine hyperplasia     BP 120/84 (BP Location: Left Arm, Patient Position: Sitting, Cuff Size: Normal)   Pulse 93   Temp 97.7 F (36.5 C) (Temporal)   Ht 5\' 5"  (1.651 m)   Wt 151 lb 8 oz (68.7 kg)   LMP 06/17/2010 (Within Years)   SpO2 96%   BMI 25.21 kg/m  Gen: awake, alert, appearing stated age Lungs: No accessory muscle use Skin: See below.  There is a surrounding ring of erythema that is warm to the touch and painful. Psych: Age appropriate judgment and insight       Procedure note; shave biopsy Informed consent was obtained. The area was cleaned with alcohol and injected with 5 mL of 1% lidocaine with epinephrine. A Dermablade was slightly bent and used to cut under the area of interest. The specimen was placed in a sterile specimen cup and sent to the lab. The area was then cauterized ensuring adequate hemostasis. The area was dressed with triple antibiotic ointment and a bandage. There were no complications noted. The patient tolerated the procedure well.   Cellulitis of left lower extremity - Plan: cephALEXin (KEFLEX) 500 MG capsule  Neoplasm of uncertain behavior - Plan: Dermatology pathology([place]), PR SHAV SKIN LES >2.1 CM TRUNK,ARM,LEG  Treated with Keflex for surrounding cellulitis.  Eager to see the results of the biopsy.  Suspect squamous cell carcinoma. F/u prn. The patient voiced understanding and agreement to the plan.  [place], DO 03/26/19 5:10  PM

## 2019-03-26 NOTE — Patient Instructions (Addendum)
Do not shower for the rest of the day. When you do wash it, use only soap and water. Do not vigorously scrub. Apply triple antibiotic ointment (like Neosporin) twice daily. Keep the area clean and dry.   Things to look out for: increasing pain not relieved by ibuprofen/acetaminophen, fevers, spreading redness, drainage of pus, or foul odor.  Give us a week to get the results of your biopsy back.   Let us know if you need anything.  

## 2019-03-31 ENCOUNTER — Other Ambulatory Visit: Payer: Self-pay | Admitting: Family Medicine

## 2019-03-31 DIAGNOSIS — C44729 Squamous cell carcinoma of skin of left lower limb, including hip: Secondary | ICD-10-CM

## 2019-03-31 MED ORDER — DOXYCYCLINE HYCLATE 100 MG PO TABS: 100.0000 mg | ORAL_TABLET | Freq: Two times a day (BID) | ORAL | 0 refills | Status: AC

## 2019-04-02 DIAGNOSIS — C44729 Squamous cell carcinoma of skin of left lower limb, including hip: Secondary | ICD-10-CM | POA: Diagnosis not present

## 2019-04-05 ENCOUNTER — Encounter: Payer: Self-pay | Admitting: Family Medicine

## 2019-04-05 DIAGNOSIS — C44721 Squamous cell carcinoma of skin of unspecified lower limb, including hip: Secondary | ICD-10-CM | POA: Insufficient documentation

## 2019-04-06 DIAGNOSIS — C44729 Squamous cell carcinoma of skin of left lower limb, including hip: Secondary | ICD-10-CM | POA: Diagnosis not present

## 2019-04-16 ENCOUNTER — Other Ambulatory Visit: Payer: Self-pay

## 2019-04-19 ENCOUNTER — Encounter: Payer: Self-pay | Admitting: Family Medicine

## 2019-04-19 ENCOUNTER — Other Ambulatory Visit: Payer: Self-pay

## 2019-04-19 ENCOUNTER — Ambulatory Visit (INDEPENDENT_AMBULATORY_CARE_PROVIDER_SITE_OTHER): Payer: Medicare Other | Admitting: Family Medicine

## 2019-04-19 VITALS — BP 100/66 | HR 72 | Temp 97.9°F | Resp 18 | Ht 65.0 in | Wt 152.4 lb

## 2019-04-19 DIAGNOSIS — E2839 Other primary ovarian failure: Secondary | ICD-10-CM | POA: Diagnosis not present

## 2019-04-19 DIAGNOSIS — E041 Nontoxic single thyroid nodule: Secondary | ICD-10-CM | POA: Diagnosis not present

## 2019-04-19 DIAGNOSIS — C44729 Squamous cell carcinoma of skin of left lower limb, including hip: Secondary | ICD-10-CM | POA: Diagnosis not present

## 2019-04-19 DIAGNOSIS — E785 Hyperlipidemia, unspecified: Secondary | ICD-10-CM | POA: Diagnosis not present

## 2019-04-19 LAB — COMPREHENSIVE METABOLIC PANEL
ALT: 19 U/L (ref 0–35)
AST: 19 U/L (ref 0–37)
Albumin: 4.1 g/dL (ref 3.5–5.2)
Alkaline Phosphatase: 89 U/L (ref 39–117)
BUN: 8 mg/dL (ref 6–23)
CO2: 30 mEq/L (ref 19–32)
Calcium: 9.4 mg/dL (ref 8.4–10.5)
Chloride: 103 mEq/L (ref 96–112)
Creatinine, Ser: 0.66 mg/dL (ref 0.40–1.20)
GFR: 89.37 mL/min (ref >60.00)
Glucose, Bld: 88 mg/dL (ref 70–99)
Potassium: 4.3 mEq/L (ref 3.5–5.1)
Sodium: 141 mEq/L (ref 135–145)
Total Bilirubin: 0.4 mg/dL (ref 0.2–1.2)
Total Protein: 6.5 g/dL (ref 6.0–8.3)

## 2019-04-19 LAB — LIPID PANEL
Cholesterol: 235 mg/dL — ABNORMAL HIGH (ref 0–200)
HDL: 68.5 mg/dL (ref >39.00)
LDL Cholesterol: 144 mg/dL — ABNORMAL HIGH (ref 0–99)
NonHDL: 166.45
Total CHOL/HDL Ratio: 3
Triglycerides: 112 mg/dL (ref 0.0–149.0)
VLDL: 22.4 mg/dL (ref 0.0–40.0)

## 2019-04-19 LAB — TSH: TSH: 3.36 u[IU]/mL (ref 0.35–4.50)

## 2019-04-19 NOTE — Patient Instructions (Addendum)
Preventive Care 67 Years and Older, Female Preventive care refers to lifestyle choices and visits with your health care provider that can promote health and wellness. This includes:  A yearly physical exam. This is also called an annual well check.  Regular dental and eye exams.  Immunizations.  Screening for certain conditions.  Healthy lifestyle choices, such as diet and exercise. What can I expect for my preventive care visit? Physical exam Your health care provider will check:  Height and weight. These may be used to calculate body mass index (BMI), which is a measurement that tells if you are at a healthy weight.  Heart rate and blood pressure.  Your skin for abnormal spots. Counseling Your health care provider may ask you questions about:  Alcohol, tobacco, and drug use.  Emotional well-being.  Home and relationship well-being.  Sexual activity.  Eating habits.  History of falls.  Memory and ability to understand (cognition).  Work and work Statistician.  Pregnancy and menstrual history. What immunizations do I need?  Influenza (flu) vaccine  This is recommended every year. Tetanus, diphtheria, and pertussis (Tdap) vaccine  You may need a Td booster every 10 years. Varicella (chickenpox) vaccine  You may need this vaccine if you have not already been vaccinated. Zoster (shingles) vaccine  You may need this after age 33. Pneumococcal conjugate (PCV13) vaccine  One dose is recommended after age 33. Pneumococcal polysaccharide (PPSV23) vaccine  One dose is recommended after age 72. Measles, mumps, and rubella (MMR) vaccine  You may need at least one dose of MMR if you were born in 1957 or later. You may also need a second dose. Meningococcal conjugate (MenACWY) vaccine  You may need this if you have certain conditions. Hepatitis A vaccine  You may need this if you have certain conditions or if you travel or work in places where you may be exposed  to hepatitis A. Hepatitis B vaccine  You may need this if you have certain conditions or if you travel or work in places where you may be exposed to hepatitis B. Haemophilus influenzae type b (Hib) vaccine  You may need this if you have certain conditions. You may receive vaccines as individual doses or as more than one vaccine together in one shot (combination vaccines). Talk with your health care provider about the risks and benefits of combination vaccines. What tests do I need? Blood tests  Lipid and cholesterol levels. These may be checked every 5 years, or more frequently depending on your overall health.  Hepatitis C test.  Hepatitis B test. Screening  Lung cancer screening. You may have this screening every year starting at age 67 if you have a 30-pack-year history of smoking and currently smoke or have quit within the past 15 years.  Colorectal cancer screening. All adults should have this screening starting at age 67 and continuing until age 15. Your health care provider may recommend screening at age 23 if you are at increased risk. You will have tests every 1-10 years, depending on your results and the type of screening test.  Diabetes screening. This is done by checking your blood sugar (glucose) after you have not eaten for a while (fasting). You may have this done every 1-3 years.  Mammogram. This may be done every 1-2 years. Talk with your health care provider about how often you should have regular mammograms.  BRCA-related cancer screening. This may be done if you have a family history of breast, ovarian, tubal, or peritoneal cancers.  Other tests  Sexually transmitted disease (STD) testing.  Bone density scan. This is done to screen for osteoporosis. You may have this done starting at age 67. Follow these instructions at home: Eating and drinking  Eat a diet that includes fresh fruits and vegetables, whole grains, lean protein, and low-fat dairy products. Limit  your intake of foods with high amounts of sugar, saturated fats, and salt.  Take vitamin and mineral supplements as recommended by your health care provider.  Do not drink alcohol if your health care provider tells you not to drink.  If you drink alcohol: ? Limit how much you have to 0-1 drink a day. ? Be aware of how much alcohol is in your drink. In the [place] one drink equals one 12 oz bottle of beer (355 mL), one 5 oz glass of wine (148 mL), or one 1 oz glass of hard liquor (44 mL). Lifestyle  Take daily care of your teeth and gums.  Stay active. Exercise for at least 30 minutes on 5 or more days each week.  Do not use any products that contain nicotine or tobacco, such as cigarettes, e-cigarettes, and chewing tobacco. If you need help quitting, ask your health care provider.  If you are sexually active, practice safe sex. Use a condom or other form of protection in order to prevent STIs (sexually transmitted infections).  Talk with your health care provider about taking a low-dose aspirin or statin. What's next?  Go to your health care provider once a year for a well check visit.  Ask your health care provider how often you should have your eyes and teeth checked.  Stay up to date on all vaccines. This information is not intended to replace advice given to you by your health care provider. Make sure you discuss any questions you have with your health care provider. Document Released: 06/30/2015 Document Revised: 05/28/2018 Document Reviewed: 05/28/2018 Elsevier Patient Education  2020 [place] 67 Years and Older, Female Preventive care refers to lifestyle choices and visits with your health care provider that can promote health and wellness. This includes:  A yearly physical exam. This is also called an annual well check.  Regular dental and eye exams.  Immunizations.  Screening for certain conditions.  Healthy lifestyle choices, such as diet and  exercise. What can I expect for my preventive care visit? Physical exam Your health care provider will check:  Height and weight. These may be used to calculate body mass index (BMI), which is a measurement that tells if you are at a healthy weight.  Heart rate and blood pressure.  Your skin for abnormal spots. Counseling Your health care provider may ask you questions about:  Alcohol, tobacco, and drug use.  Emotional well-being.  Home and relationship well-being.  Sexual activity.  Eating habits.  History of falls.  Memory and ability to understand (cognition).  Work and work Statistician.  Pregnancy and menstrual history. What immunizations do I need?  Influenza (flu) vaccine  This is recommended every year. Tetanus, diphtheria, and pertussis (Tdap) vaccine  You may need a Td booster every 10 years. Varicella (chickenpox) vaccine  You may need this vaccine if you have not already been vaccinated. Zoster (shingles) vaccine  You may need this after age 26. Pneumococcal conjugate (PCV13) vaccine  One dose is recommended after age 7. Pneumococcal polysaccharide (PPSV23) vaccine  One dose is recommended after age 37. Measles, mumps, and rubella (MMR) vaccine  You may need at  least one dose of MMR if you were born in 1957 or later. You may also need a second dose. Meningococcal conjugate (MenACWY) vaccine  You may need this if you have certain conditions. Hepatitis A vaccine  You may need this if you have certain conditions or if you travel or work in places where you may be exposed to hepatitis A. Hepatitis B vaccine  You may need this if you have certain conditions or if you travel or work in places where you may be exposed to hepatitis B. Haemophilus influenzae type b (Hib) vaccine  You may need this if you have certain conditions. You may receive vaccines as individual doses or as more than one vaccine together in one shot (combination vaccines). Talk  with your health care provider about the risks and benefits of combination vaccines. What tests do I need? Blood tests  Lipid and cholesterol levels. These may be checked every 5 years, or more frequently depending on your overall health.  Hepatitis C test.  Hepatitis B test. Screening  Lung cancer screening. You may have this screening every year starting at age 36 if you have a 30-pack-year history of smoking and currently smoke or have quit within the past 15 years.  Colorectal cancer screening. All adults should have this screening starting at age 43 and continuing until age 18. Your health care provider may recommend screening at age 42 if you are at increased risk. You will have tests every 1-10 years, depending on your results and the type of screening test.  Diabetes screening. This is done by checking your blood sugar (glucose) after you have not eaten for a while (fasting). You may have this done every 1-3 years.  Mammogram. This may be done every 1-2 years. Talk with your health care provider about how often you should have regular mammograms.  BRCA-related cancer screening. This may be done if you have a family history of breast, ovarian, tubal, or peritoneal cancers. Other tests  Sexually transmitted disease (STD) testing.  Bone density scan. This is done to screen for osteoporosis. You may have this done starting at age 87. Follow these instructions at home: Eating and drinking  Eat a diet that includes fresh fruits and vegetables, whole grains, lean protein, and low-fat dairy products. Limit your intake of foods with high amounts of sugar, saturated fats, and salt.  Take vitamin and mineral supplements as recommended by your health care provider.  Do not drink alcohol if your health care provider tells you not to drink.  If you drink alcohol: ? Limit how much you have to 0-1 drink a day. ? Be aware of how much alcohol is in your drink. In the [place] one drink equals  one 12 oz bottle of beer (355 mL), one 5 oz glass of wine (148 mL), or one 1 oz glass of hard liquor (44 mL). Lifestyle  Take daily care of your teeth and gums.  Stay active. Exercise for at least 30 minutes on 5 or more days each week.  Do not use any products that contain nicotine or tobacco, such as cigarettes, e-cigarettes, and chewing tobacco. If you need help quitting, ask your health care provider.  If you are sexually active, practice safe sex. Use a condom or other form of protection in order to prevent STIs (sexually transmitted infections).  Talk with your health care provider about taking a low-dose aspirin or statin. What's next?  Go to your health care provider once a year for a  well check visit.  Ask your health care provider how often you should have your eyes and teeth checked.  Stay up to date on all vaccines. This information is not intended to replace advice given to you by your health care provider. Make sure you discuss any questions you have with your health care provider. Document Released: 06/30/2015 Document Revised: 05/28/2018 Document Reviewed: 05/28/2018 Elsevier Patient Education  2020 [employer].

## 2019-04-19 NOTE — Progress Notes (Signed)
Subjective:     Paige Waters is a 68 y.o. female and is here for a comprehensive physical exam. The patient reports no problems.  Social History   Socioeconomic History  . Marital status: Married    Spouse name: Not on file  . Number of children: Not on file  . Years of education: Not on file  . Highest education level: Not on file  Occupational History  . Not on file  Social Needs  . Financial resource strain: Not on file  . Food insecurity    Worry: Not on file    Inability: Not on file  . Transportation needs    Medical: Not on file    Non-medical: Not on file  Tobacco Use  . Smoking status: Former Smoker    Quit date: 06/17/1980    Years since quitting: 38.8  . Smokeless tobacco: Never Used  Substance and Sexual Activity  . Alcohol use: Yes    Comment: occas  . Drug use: No  . Sexual activity: Yes    Partners: Male  Lifestyle  . Physical activity    Days per week: Not on file    Minutes per session: Not on file  . Stress: Not on file  Relationships  . Social Herbalist on phone: Not on file    Gets together: Not on file    Attends religious service: Not on file    Active member of club or organization: Not on file    Attends meetings of clubs or organizations: Not on file    Relationship status: Not on file  . Intimate partner violence    Fear of current or ex partner: Not on file    Emotionally abused: Not on file    Physically abused: Not on file    Forced sexual activity: Not on file  Other Topics Concern  . Not on file  Social History Narrative   rowing   Health Maintenance  Topic Date Due  . DEXA SCAN  12/24/2013  . INFLUENZA VACCINE  09/15/2019 (Originally 01/16/2019)  . PNA vac Low Risk Adult (1 of 2 - PCV13) 04/18/2020 (Originally 06/16/2017)  . MAMMOGRAM  04/30/2019  . COLONOSCOPY  07/02/2026  . TETANUS/TDAP  04/16/2028  . Hepatitis C Screening  Completed    The following portions of the patient's history were reviewed and updated  as appropriate:  She  has a past medical history of Colon polyps, Hyperlipidemia, Osteopenia, Thyroid nodule, and Uterine hyperplasia. She does not have any pertinent problems on file. She  has a past surgical history that includes laparoscopy and Colonoscopy (2007). Her family history includes Colon polyps in her brother; Coronary artery disease (age of onset: 37) in her father; Heart attack in her father; Heart disease (age of onset: 72) in her father; Hyperlipidemia in her brother and father; Kidney disease in her brother; Osteoporosis in her mother. She  reports that she quit smoking about 38 years ago. She has never used smokeless tobacco. She reports current alcohol use. She reports that she does not use drugs. She has a current medication list which includes the following prescription(s): calcium-magnesium-vitamin d, co-enzyme q-10, turmeric, black cohosh, and red yeast rice. Current Outpatient Medications on File Prior to Visit  Medication Sig Dispense Refill  . Calcium-Magnesium-Vitamin D [id number] MG-MG-UNIT TABS Take 1 tablet by mouth daily.      Marland Kitchen co-enzyme Q-10 30 MG capsule Take 30 mg by mouth 3 (three) times daily.    Marland Kitchen  TURMERIC PO Take by mouth.    . Black Cohosh 40 MG CAPS Take 2 capsules by mouth daily.      . Red Yeast Rice 600 MG CAPS Take by mouth.     No current facility-administered medications on file prior to visit.    She has No Known Allergies..  Review of Systems Review of Systems  Constitutional: Negative for activity change, appetite change and fatigue.  HENT: Negative for hearing loss, congestion, tinnitus and ear discharge.  dentist q49m Eyes: Negative for visual disturbance (see optho q1y -- vision corrected to 20/20 with glasses).  Respiratory: Negative for cough, chest tightness and shortness of breath.   Cardiovascular: Negative for chest pain, palpitations and leg swelling.  Gastrointestinal: Negative for abdominal pain, diarrhea, constipation and  abdominal distention.  Genitourinary: Negative for urgency, frequency, decreased urine volume and difficulty urinating.  Musculoskeletal: Negative for back pain, arthralgias and gait problem.  Skin: Negative for color change, pallor and rash.  Neurological: Negative for dizziness, light-headedness, numbness and headaches.  Hematological: Negative for adenopathy. Does not bruise/bleed easily.  Psychiatric/Behavioral: Negative for suicidal ideas, confusion, sleep disturbance, self-injury, dysphoric mood, decreased concentration and agitation.       Objective:    BP 100/66 (BP Location: Right Arm, Patient Position: Sitting, Cuff Size: Normal)   Pulse 72   Temp 97.9 F (36.6 C) (Temporal)   Resp 18   Ht 5\' 5"  (1.651 m)   Wt 152 lb 6.4 oz (69.1 kg)   LMP 06/17/2010 (Within Years)   SpO2 96%   BMI 25.36 kg/m  General appearance: alert, cooperative, appears stated age and no distress Head: Normocephalic, without obvious abnormality, atraumatic Eyes: negative findings: lids and lashes normal, conjunctivae and sclerae normal and pupils equal, round, reactive to light and accomodation Ears: normal TM's and external ear canals both ears Neck: no adenopathy, no carotid bruit, no JVD, supple, symmetrical, trachea midline and thyroid not enlarged, symmetric, no tenderness/mass/nodules Back: symmetric, no curvature. ROM normal. No CVA tenderness. Lungs: clear to auscultation bilaterally Breasts: normal appearance, no masses or tenderness Heart: regular rate and rhythm, S1, S2 normal, no murmur, click, rub or gallop Abdomen: soft, non-tender; bowel sounds normal; no masses,  no organomegaly Pelvic: not indicated; post-menopausal, no abnormal Pap smears in past Extremities: extremities normal, atraumatic, no cyanosis or edema Pulses: 2+ and symmetric Skin: Skin color, texture, turgor normal. No rashes or lesions Lymph nodes: Cervical, supraclavicular, and axillary nodes normal. Neurologic:  Alert and oriented X 3, normal strength and tone. Normal symmetric reflexes. Normal coordination and gait    Assessment:    Healthy female exam.      Plan:     ghm utd Check labs  See After Visit Summary for Counseling Recommendations    1. Thyroid nodule  - TSH  2. Hyperlipidemia, unspecified hyperlipidemia type Encouraged heart healthy diet, increase exercise, avoid trans fats, consider a krill oil cap daily - Lipid panel - Comprehensive metabolic panel  3. Estrogen deficiency bmd Weight bearing exercise con't calcium  And vita d - DG Bone Density; Future

## 2019-04-19 NOTE — Assessment & Plan Note (Signed)
Per derm 

## 2019-05-20 DIAGNOSIS — M8589 Other specified disorders of bone density and structure, multiple sites: Secondary | ICD-10-CM | POA: Diagnosis not present

## 2019-05-20 DIAGNOSIS — Z1231 Encounter for screening mammogram for malignant neoplasm of breast: Secondary | ICD-10-CM | POA: Diagnosis not present

## 2019-05-20 DIAGNOSIS — Z8262 Family history of osteoporosis: Secondary | ICD-10-CM | POA: Diagnosis not present

## 2019-05-20 LAB — HM DEXA SCAN

## 2019-05-20 LAB — HM MAMMOGRAPHY

## 2019-07-19 DIAGNOSIS — Z85828 Personal history of other malignant neoplasm of skin: Secondary | ICD-10-CM | POA: Diagnosis not present

## 2019-07-19 DIAGNOSIS — D2262 Melanocytic nevi of left upper limb, including shoulder: Secondary | ICD-10-CM | POA: Diagnosis not present

## 2019-07-19 DIAGNOSIS — D1801 Hemangioma of skin and subcutaneous tissue: Secondary | ICD-10-CM | POA: Diagnosis not present

## 2019-07-19 DIAGNOSIS — D2261 Melanocytic nevi of right upper limb, including shoulder: Secondary | ICD-10-CM | POA: Diagnosis not present

## 2019-07-19 DIAGNOSIS — L821 Other seborrheic keratosis: Secondary | ICD-10-CM | POA: Diagnosis not present

## 2019-11-05 DIAGNOSIS — H524 Presbyopia: Secondary | ICD-10-CM | POA: Diagnosis not present

## 2019-12-01 NOTE — Progress Notes (Signed)
I connected with Paige Waters today by telephone and verified that I am speaking with the correct person using two identifiers. Location patient: home Location provider: work Persons participating in the virtual visit: patient, Paige Waters.    I discussed the limitations, risks, security and privacy concerns of performing an evaluation and management service by telephone and the availability of in person appointments. I also discussed with the patient that there may be a patient responsible charge related to this service. The patient expressed understanding and verbally consented to this telephonic visit.    Interactive audio and video telecommunications were attempted between this RN and patient, however failed, due to patient having technical difficulties OR patient did not have access to video capability.  We continued and completed visit with audio only.  Some vital signs may be absent or patient reported.   Subjective:   Paige Waters is a 70 y.o. female who presents for an Initial Medicare Annual Wellness Visit.  Enjoys reading and doing puzzles. Still works part time at [employer].  Review of Systems    Home Safety/Smoke Alarms: Feels safe in home. Smoke alarms in place.  Lives alone. 2 story home. Dtr call her daily.   Female:      Mammo- 05/20/19      Dexa scan-  05/20/19      CCS-07/02/16. Recall 10 yrs.     Objective:      Advanced Directives 12/02/2019  Does Patient Have a Medical Advance Directive? No  Does patient want to make changes to medical advance directive? No - Patient declined    Current Medications (verified) Outpatient Encounter Medications as of 12/02/2019  Medication Sig  . Calcium-Magnesium-Vitamin D [phone] MG-MG-UNIT TABS Take 1 tablet by mouth daily.    Marland Kitchen co-enzyme Q-10 30 MG capsule Take 30 mg by mouth 3 (three) times daily.  . TURMERIC PO Take by mouth.  . [DISCONTINUED] Black Cohosh 40 MG CAPS Take 2 capsules by mouth daily.    . [DISCONTINUED] Red  Yeast Rice 600 MG CAPS Take by mouth.   No facility-administered encounter medications on file as of 12/02/2019.    Allergies (verified) Patient has no known allergies.   History: Past Medical History:  Diagnosis Date  . Colon polyps   . Hyperlipidemia   . Osteopenia   . Thyroid nodule   . Uterine hyperplasia    Past Surgical History:  Procedure Laterality Date  . COLONOSCOPY  2007  . LAPAROSCOPY     Family History  Problem Relation Age of Onset  . Coronary artery disease Father 53  . Heart disease Father 20       MI---2nd one at 37  . Heart attack Father   . Hyperlipidemia Father   . Osteoporosis Mother   . Colon polyps Brother   . Kidney disease Brother        dialysis  . Hyperlipidemia Brother   . Diabetes Neg Hx   . Hypertension Neg Hx   . Sudden death Neg Hx   . Colon cancer Neg Hx    Social History   Socioeconomic History  . Marital status: Married    Spouse name: Not on file  . Number of children: Not on file  . Years of education: Not on file  . Highest education level: Not on file  Occupational History  . Not on file  Tobacco Use  . Smoking status: Former Smoker    Quit date: 06/17/1980    Years since quitting: 39.4  .  Smokeless tobacco: Never Used  Substance and Sexual Activity  . Alcohol use: Yes    Comment: occas  . Drug use: No  . Sexual activity: Yes    Partners: Male  Other Topics Concern  . Not on file  Social History Narrative   rowing   Social Determinants of Health   Financial Resource Strain: Low Risk   . Difficulty of Paying Living Expenses: Not hard at all  Food Insecurity: No Food Insecurity  . Worried About Charity fundraiser in the Last Year: Never true  . Ran Out of Food in the Last Year: Never true  Transportation Needs: No Transportation Needs  . Lack of Transportation (Medical): No  . Lack of Transportation (Non-Medical): No  Physical Activity:   . Days of Exercise per Week:   . Minutes of Exercise per Session:     Stress:   . Feeling of Stress :   Social Connections:   . Frequency of Communication with Friends and Family:   . Frequency of Social Gatherings with Friends and Family:   . Attends Religious Services:   . Active Member of Clubs or Organizations:   . Attends Archivist Meetings:   Marland Kitchen Marital Status:     Tobacco Counseling Counseling given: Not Answered   Clinical Intake:    Pain : No/denies pain     Activities of Daily Living In your present state of health, do you have any difficulty performing the following activities: 12/02/2019 04/19/2019  Hearing? N N  Vision? N N  Difficulty concentrating or making decisions? N N  Walking or climbing stairs? N N  Dressing or bathing? N N  Doing errands, shopping? N N  Preparing Food and eating ? N -  Using the Toilet? N -  In the past six months, have you accidently leaked urine? N -  Do you have problems with loss of bowel control? N -  Managing your Medications? N -  Managing your Finances? N -  Housekeeping or managing your Housekeeping? N -  Some recent data might be hidden     Immunizations and Health Maintenance Immunization History  Administered Date(s) Administered  . Td 05/07/2007  . Tdap 04/16/2018  . Zoster 05/06/2013  . Zoster Recombinat (Shingrix) 06/11/2018, 08/13/2018   Health Maintenance Due  Topic Date Due  . COVID-19 Vaccine (1) Never done    Patient Care Team: Carollee Herter, Alferd Apa, DO as PCP - General (Family Medicine) Carollee Herter, Alferd Apa, DO as PCP - Family Medicine Griselda Miner, MD as Consulting Physician (Dermatology)  Indicate any recent Medical Services you may have received from other than [facility] providers in the past year (date may be approximate).     Assessment:   This is a routine wellness examination for Paige Waters. Physical assessment deferred to PCP.  Hearing/Vision screen Unable to assess. This visit is enabled though telemedicine due to Covid 19.   Dietary issues and  exercise activities discussed: Diet (meal preparation, eat out, water intake, caffeinated beverages, dairy products, fruits and vegetables): Eats a plant based diet   Current Exercise Habits: Home exercise routine, Type of exercise: strength training/weights;walking, Time (Minutes): 60, Frequency (Times/Week): 4, Weekly Exercise (Minutes/Week): 240, Intensity: Mild, Exercise limited by: None identified  Goals    . Continue exercising and eating a healthy diet.      Depression Screen PHQ 2/9 Scores 12/02/2019 04/08/2017 05/06/2013  PHQ - 2 Score 0 0 0    Fall Risk Fall Risk  12/02/2019 04/08/2017  Falls in the past year? 0 No  Number falls in past yr: 0 -  Injury with Fall? 0 -  Follow up Education provided;Falls prevention discussed -    Cognitive Function: Ad8 score reviewed for issues:  Issues making decisions:no  Less interest in hobbies / activities:no  Repeats questions, stories (family complaining):no  Trouble using ordinary gadgets (microwave, computer, phone):no  Forgets the month or year: no  Mismanaging finances: no  Remembering appts:no  Daily problems with thinking and/or memory:no Ad8 score is=0        Screening Tests Health Maintenance  Topic Date Due  . COVID-19 Vaccine (1) Never done  . PNA vac Low Risk Adult (1 of 2 - PCV13) 04/18/2020 (Originally 06/16/2017)  . INFLUENZA VACCINE  01/16/2020  . MAMMOGRAM  05/19/2020  . DEXA SCAN  05/19/2021  . COLONOSCOPY  07/02/2026  . TETANUS/TDAP  04/16/2028  . Hepatitis C Screening  Completed      Plan:    Please schedule your next medicare wellness visit with me in 1 yr.  Continue to eat heart healthy diet (full of fruits, vegetables, whole grains, lean protein, water--limit salt, fat, and sugar intake) and increase physical activity as tolerated.  Continue doing brain stimulating activities (puzzles, reading, adult coloring books, staying active) to keep memory sharp.     I have personally  reviewed and noted the following in the patient's chart:   . Medical and social history . Use of alcohol, tobacco or illicit drugs  . Current medications and supplements . Functional ability and status . Nutritional status . Physical activity . Advanced directives . List of other physicians . Hospitalizations, surgeries, and ER visits in previous 12 months . Vitals . Screenings to include cognitive, depression, and falls . Referrals and appointments  In addition, I have reviewed and discussed with patient certain preventive protocols, quality metrics, and best practice recommendations. A written personalized care plan for preventive services as well as general preventive health recommendations were provided to patient.   Due to this being a telephonic visit, the after visit summary with patients personalized plan was offered to patient via mail or my-chart. Patient declined at this time.   Shela Nevin, [place]   12/02/2019

## 2019-12-02 ENCOUNTER — Encounter: Payer: Self-pay | Admitting: *Deleted

## 2019-12-02 ENCOUNTER — Ambulatory Visit (INDEPENDENT_AMBULATORY_CARE_PROVIDER_SITE_OTHER): Payer: Medicare Other | Admitting: *Deleted

## 2019-12-02 DIAGNOSIS — Z0000 Encounter for general adult medical examination without abnormal findings: Secondary | ICD-10-CM | POA: Diagnosis not present

## 2019-12-02 NOTE — Patient Instructions (Signed)
Please schedule your next medicare wellness visit with me in 1 yr.  Continue to eat heart healthy diet (full of fruits, vegetables, whole grains, lean protein, water--limit salt, fat, and sugar intake) and increase physical activity as tolerated.  Continue doing brain stimulating activities (puzzles, reading, adult coloring books, staying active) to keep memory sharp.    Paige Waters , Thank you for taking time to come for your Medicare Wellness Visit. I appreciate your ongoing commitment to your health goals. Please review the following plan we discussed and let me know if I can assist you in the future.   These are the goals we discussed: Goals    . Continue exercising and eating a healthy diet.       This is a list of the screening recommended for you and due dates:  Health Maintenance  Topic Date Due  . COVID-19 Vaccine (1) Never done  . Pneumonia vaccines (1 of 2 - PCV13) 04/18/2020*  . Flu Shot  01/16/2020  . Mammogram  05/19/2020  . DEXA scan (bone density measurement)  05/19/2021  . Colon Cancer Screening  07/02/2026  . Tetanus Vaccine  04/16/2028  .  Hepatitis C: One time screening is recommended by Center for Disease Control  (CDC) for  adults born from [birth year] through 1964.   Completed  *Topic was postponed. The date shown is not the original due date.  ' Preventive Care 78 Years and Older, Female Preventive care refers to lifestyle choices and visits with your health care provider that can promote health and wellness. This includes:  A yearly physical exam. This is also called an annual well check.  Regular dental and eye exams.  Immunizations.  Screening for certain conditions.  Healthy lifestyle choices, such as diet and exercise. What can I expect for my preventive care visit? Physical exam Your health care provider will check:  Height and weight. These may be used to calculate body mass index (BMI), which is a measurement that tells if you are at a healthy  weight.  Heart rate and blood pressure.  Your skin for abnormal spots. Counseling Your health care provider may ask you questions about:  Alcohol, tobacco, and drug use.  Emotional well-being.  Home and relationship well-being.  Sexual activity.  Eating habits.  History of falls.  Memory and ability to understand (cognition).  Work and work Statistician.  Pregnancy and menstrual history. What immunizations do I need?  Influenza (flu) vaccine  This is recommended every year. Tetanus, diphtheria, and pertussis (Tdap) vaccine  You may need a Td booster every 10 years. Varicella (chickenpox) vaccine  You may need this vaccine if you have not already been vaccinated. Zoster (shingles) vaccine  You may need this after age 53. Pneumococcal conjugate (PCV13) vaccine  One dose is recommended after age 45. Pneumococcal polysaccharide (PPSV23) vaccine  One dose is recommended after age 21. Measles, mumps, and rubella (MMR) vaccine  You may need at least one dose of MMR if you were born in 1957 or later. You may also need a second dose. Meningococcal conjugate (MenACWY) vaccine  You may need this if you have certain conditions. Hepatitis A vaccine  You may need this if you have certain conditions or if you travel or work in places where you may be exposed to hepatitis A. Hepatitis B vaccine  You may need this if you have certain conditions or if you travel or work in places where you may be exposed to hepatitis B. Haemophilus influenzae type  b (Hib) vaccine  You may need this if you have certain conditions. You may receive vaccines as individual doses or as more than one vaccine together in one shot (combination vaccines). Talk with your health care provider about the risks and benefits of combination vaccines. What tests do I need? Blood tests  Lipid and cholesterol levels. These may be checked every 5 years, or more frequently depending on your overall  health.  Hepatitis C test.  Hepatitis B test. Screening  Lung cancer screening. You may have this screening every year starting at age 76 if you have a 30-pack-year history of smoking and currently smoke or have quit within the past 15 years.  Colorectal cancer screening. All adults should have this screening starting at age 28 and continuing until age 51. Your health care provider may recommend screening at age 110 if you are at increased risk. You will have tests every 1-10 years, depending on your results and the type of screening test.  Diabetes screening. This is done by checking your blood sugar (glucose) after you have not eaten for a while (fasting). You may have this done every 1-3 years.  Mammogram. This may be done every 1-2 years. Talk with your health care provider about how often you should have regular mammograms.  BRCA-related cancer screening. This may be done if you have a family history of breast, ovarian, tubal, or peritoneal cancers. Other tests  Sexually transmitted disease (STD) testing.  Bone density scan. This is done to screen for osteoporosis. You may have this done starting at age 35. Follow these instructions at home: Eating and drinking  Eat a diet that includes fresh fruits and vegetables, whole grains, lean protein, and low-fat dairy products. Limit your intake of foods with high amounts of sugar, saturated fats, and salt.  Take vitamin and mineral supplements as recommended by your health care provider.  Do not drink alcohol if your health care provider tells you not to drink.  If you drink alcohol: ? Limit how much you have to 0-1 drink a day. ? Be aware of how much alcohol is in your drink. In the [place] one drink equals one 12 oz bottle of beer (355 mL), one 5 oz glass of wine (148 mL), or one 1 oz glass of hard liquor (44 mL). Lifestyle  Take daily care of your teeth and gums.  Stay active. Exercise for at least 30 minutes on 5 or more days  each week.  Do not use any products that contain nicotine or tobacco, such as cigarettes, e-cigarettes, and chewing tobacco. If you need help quitting, ask your health care provider.  If you are sexually active, practice safe sex. Use a condom or other form of protection in order to prevent STIs (sexually transmitted infections).  Talk with your health care provider about taking a low-dose aspirin or statin. What's next?  Go to your health care provider once a year for a well check visit.  Ask your health care provider how often you should have your eyes and teeth checked.  Stay up to date on all vaccines. This information is not intended to replace advice given to you by your health care provider. Make sure you discuss any questions you have with your health care provider. Document Revised: 05/28/2018 Document Reviewed: 05/28/2018 Elsevier Patient Education  2020 [employer].

## 2020-01-05 ENCOUNTER — Ambulatory Visit (INDEPENDENT_AMBULATORY_CARE_PROVIDER_SITE_OTHER): Payer: Medicare Other | Admitting: Medical

## 2020-01-05 ENCOUNTER — Other Ambulatory Visit: Payer: Self-pay

## 2020-01-05 NOTE — Progress Notes (Signed)
   Subjective:    Patient ID: Paige Waters, female    DOB: [birth year], 69 y.o.   MRN: [id number]  HPI  Pt left so no charge.  Review of Systems     Objective:   Physical Exam        Assessment & Plan:

## 2020-04-21 ENCOUNTER — Ambulatory Visit (INDEPENDENT_AMBULATORY_CARE_PROVIDER_SITE_OTHER): Payer: Medicare Other | Admitting: Family Medicine

## 2020-04-21 ENCOUNTER — Other Ambulatory Visit: Payer: Self-pay

## 2020-04-21 ENCOUNTER — Encounter: Payer: Self-pay | Admitting: Family Medicine

## 2020-04-21 VITALS — BP 106/80 | HR 63 | Temp 98.2°F | Resp 18 | Ht 65.0 in | Wt 154.2 lb

## 2020-04-21 DIAGNOSIS — Z0000 Encounter for general adult medical examination without abnormal findings: Secondary | ICD-10-CM

## 2020-04-21 DIAGNOSIS — I259 Chronic ischemic heart disease, unspecified: Secondary | ICD-10-CM | POA: Diagnosis not present

## 2020-04-21 DIAGNOSIS — Z20822 Contact with and (suspected) exposure to covid-19: Secondary | ICD-10-CM | POA: Diagnosis not present

## 2020-04-21 DIAGNOSIS — Z136 Encounter for screening for cardiovascular disorders: Secondary | ICD-10-CM

## 2020-04-21 LAB — CBC WITH DIFFERENTIAL/PLATELET
Absolute Monocytes: 312 cells/uL (ref 200–950)
Basophils Absolute: 31 cells/uL (ref 0–200)
Basophils Relative: 0.6 %
Eosinophils Absolute: 140 cells/uL (ref 15–500)
Eosinophils Relative: 2.7 %
HCT: 41.6 % (ref 35.0–45.0)
Hemoglobin: 14.1 g/dL (ref 11.7–15.5)
Lymphs Abs: 1898 cells/uL (ref 850–3900)
MCH: 31 pg (ref 27.0–33.0)
MCHC: 33.9 g/dL (ref 32.0–36.0)
MCV: 91.4 fL (ref 80.0–100.0)
MPV: 12.5 fL (ref 7.5–12.5)
Monocytes Relative: 6 %
Neutro Abs: 2818 cells/uL (ref 1500–7800)
Neutrophils Relative %: 54.2 %
Platelets: 200 10*3/uL (ref 140–400)
RBC: 4.55 10*6/uL (ref 3.80–5.10)
RDW: 12.5 % (ref 11.0–15.0)
Total Lymphocyte: 36.5 %
WBC: 5.2 10*3/uL (ref 3.8–10.8)

## 2020-04-21 LAB — COMPREHENSIVE METABOLIC PANEL
AG Ratio: 1.8 (calc) (ref 1.0–2.5)
ALT: 16 U/L (ref 6–29)
AST: 12 U/L (ref 10–35)
Albumin: 4.2 g/dL (ref 3.6–5.1)
Alkaline phosphatase (APISO): 78 U/L (ref 37–153)
BUN: 13 mg/dL (ref 7–25)
CO2: 29 mmol/L (ref 20–32)
Calcium: 9.8 mg/dL (ref 8.6–10.4)
Chloride: 107 mmol/L (ref 98–110)
Creat: 0.68 mg/dL (ref 0.50–0.99)
Globulin: 2.4 g/dL (calc) (ref 1.9–3.7)
Glucose, Bld: 89 mg/dL (ref 65–99)
Potassium: 4.7 mmol/L (ref 3.5–5.3)
Sodium: 142 mmol/L (ref 135–146)
Total Bilirubin: 0.5 mg/dL (ref 0.2–1.2)
Total Protein: 6.6 g/dL (ref 6.1–8.1)

## 2020-04-21 LAB — LIPID PANEL
Cholesterol: 217 mg/dL — ABNORMAL HIGH (ref <200)
HDL: 71 mg/dL (ref >=50)
LDL Cholesterol (Calc): 126 mg/dL (calc) — ABNORMAL HIGH
Non-HDL Cholesterol (Calc): 146 mg/dL (calc) — ABNORMAL HIGH (ref <130)
Total CHOL/HDL Ratio: 3.1 (calc) (ref <5.0)
Triglycerides: 98 mg/dL (ref <150)

## 2020-04-21 NOTE — Patient Instructions (Signed)
Preventive Care 68 Years and Older, Female Preventive care refers to lifestyle choices and visits with your health care provider that can promote health and wellness. This includes:  A yearly physical exam. This is also called an annual well check.  Regular dental and eye exams.  Immunizations.  Screening for certain conditions.  Healthy lifestyle choices, such as diet and exercise. What can I expect for my preventive care visit? Physical exam Your health care provider will check:  Height and weight. These may be used to calculate body mass index (BMI), which is a measurement that tells if you are at a healthy weight.  Heart rate and blood pressure.  Your skin for abnormal spots. Counseling Your health care provider may ask you questions about:  Alcohol, tobacco, and drug use.  Emotional well-being.  Home and relationship well-being.  Sexual activity.  Eating habits.  History of falls.  Memory and ability to understand (cognition).  Work and work Statistician.  Pregnancy and menstrual history. What immunizations do I need?  Influenza (flu) vaccine  This is recommended every year. Tetanus, diphtheria, and pertussis (Tdap) vaccine  You may need a Td booster every 10 years. Varicella (chickenpox) vaccine  You may need this vaccine if you have not already been vaccinated. Zoster (shingles) vaccine  You may need this after age 33. Pneumococcal conjugate (PCV13) vaccine  One dose is recommended after age 33. Pneumococcal polysaccharide (PPSV23) vaccine  One dose is recommended after age 72. Measles, mumps, and rubella (MMR) vaccine  You may need at least one dose of MMR if you were born in 1957 or later. You may also need a second dose. Meningococcal conjugate (MenACWY) vaccine  You may need this if you have certain conditions. Hepatitis A vaccine  You may need this if you have certain conditions or if you travel or work in places where you may be exposed  to hepatitis A. Hepatitis B vaccine  You may need this if you have certain conditions or if you travel or work in places where you may be exposed to hepatitis B. Haemophilus influenzae type b (Hib) vaccine  You may need this if you have certain conditions. You may receive vaccines as individual doses or as more than one vaccine together in one shot (combination vaccines). Talk with your health care provider about the risks and benefits of combination vaccines. What tests do I need? Blood tests  Lipid and cholesterol levels. These may be checked every 5 years, or more frequently depending on your overall health.  Hepatitis C test.  Hepatitis B test. Screening  Lung cancer screening. You may have this screening every year starting at age 39 if you have a 30-pack-year history of smoking and currently smoke or have quit within the past 15 years.  Colorectal cancer screening. All adults should have this screening starting at age 36 and continuing until age 15. Your health care provider may recommend screening at age 23 if you are at increased risk. You will have tests every 1-10 years, depending on your results and the type of screening test.  Diabetes screening. This is done by checking your blood sugar (glucose) after you have not eaten for a while (fasting). You may have this done every 1-3 years.  Mammogram. This may be done every 1-2 years. Talk with your health care provider about how often you should have regular mammograms.  BRCA-related cancer screening. This may be done if you have a family history of breast, ovarian, tubal, or peritoneal cancers.  Other tests  Sexually transmitted disease (STD) testing.  Bone density scan. This is done to screen for osteoporosis. You may have this done starting at age 44. Follow these instructions at home: Eating and drinking  Eat a diet that includes fresh fruits and vegetables, whole grains, lean protein, and low-fat dairy products. Limit  your intake of foods with high amounts of sugar, saturated fats, and salt.  Take vitamin and mineral supplements as recommended by your health care provider.  Do not drink alcohol if your health care provider tells you not to drink.  If you drink alcohol: ? Limit how much you have to 0-1 drink a day. ? Be aware of how much alcohol is in your drink. In the [place] one drink equals one 12 oz bottle of beer (355 mL), one 5 oz glass of wine (148 mL), or one 1 oz glass of hard liquor (44 mL). Lifestyle  Take daily care of your teeth and gums.  Stay active. Exercise for at least 30 minutes on 5 or more days each week.  Do not use any products that contain nicotine or tobacco, such as cigarettes, e-cigarettes, and chewing tobacco. If you need help quitting, ask your health care provider.  If you are sexually active, practice safe sex. Use a condom or other form of protection in order to prevent STIs (sexually transmitted infections).  Talk with your health care provider about taking a low-dose aspirin or statin. What's next?  Go to your health care provider once a year for a well check visit.  Ask your health care provider how often you should have your eyes and teeth checked.  Stay up to date on all vaccines. This information is not intended to replace advice given to you by your health care provider. Make sure you discuss any questions you have with your health care provider. Document Revised: 05/28/2018 Document Reviewed: 05/28/2018 Elsevier Patient Education  2020 [employer].

## 2020-04-21 NOTE — Addendum Note (Signed)
Addended by: Manuela Schwartz on: 04/21/2020 08:54 AM   Modules accepted: Orders

## 2020-04-21 NOTE — Progress Notes (Addendum)
Subjective:     Paige Waters is a 69 y.o. female and is here for a comprehensive physical exam. The patient reports problems - L foot--- sometimes has numbness in L foot , top of the foot esp with exercises .  No pain/ numbness today  Social History   Socioeconomic History  . Marital status: Married    Spouse name: Not on file  . Number of children: Not on file  . Years of education: Not on file  . Highest education level: Not on file  Occupational History  . Not on file  Tobacco Use  . Smoking status: Former Smoker    Quit date: 06/17/1980    Years since quitting: 40.1  . Smokeless tobacco: Never Used  Substance and Sexual Activity  . Alcohol use: Yes    Comment: occas  . Drug use: No  . Sexual activity: Yes    Partners: Male  Other Topics Concern  . Not on file  Social History Narrative  . Not on file   Social Determinants of Health   Financial Resource Strain: Low Risk   . Difficulty of Paying Living Expenses: Not hard at all  Food Insecurity: No Food Insecurity  . Worried About Charity fundraiser in the Last Year: Never true  . Ran Out of Food in the Last Year: Never true  Transportation Needs: No Transportation Needs  . Lack of Transportation (Medical): No  . Lack of Transportation (Non-Medical): No  Physical Activity: Not on file  Stress: Not on file  Social Connections: Not on file  Intimate Partner Violence: Not on file   Health Maintenance  Topic Date Due  . COVID-19 Vaccine (2 - Booster for [employer] series) 10/22/2019  . INFLUENZA VACCINE  09/14/2020 (Originally 01/16/2020)  . PNA vac Low Risk Adult (1 of 2 - PCV13) 04/21/2021 (Originally 06/16/2017)  . DEXA SCAN  05/19/2021  . MAMMOGRAM  06/08/2021  . COLONOSCOPY (Pts 45-60yrs Insurance coverage will need to be confirmed)  07/02/2026  . TETANUS/TDAP  04/16/2028  . Hepatitis C Screening  Completed    The following portions of the patient's history were reviewed and updated as appropriate:  She  has a  past medical history of Colon polyps, Hyperlipidemia, Osteopenia, Thyroid nodule, and Uterine hyperplasia. She does not have any pertinent problems on file. She  has a past surgical history that includes laparoscopy and Colonoscopy (2007). Her family history includes Colon polyps in her brother; Coronary artery disease (age of onset: 51) in her father; Heart attack in her father; Heart disease (age of onset: 73) in her father; Hyperlipidemia in her brother and father; Kidney disease in her brother; Osteoporosis in her mother. She  reports that she quit smoking about 40 years ago. She has never used smokeless tobacco. She reports current alcohol use. She reports that she does not use drugs. She has a current medication list which includes the following prescription(s): calcium-magnesium-vitamin d, co-enzyme q-10, and turmeric. Current Outpatient Medications on File Prior to Visit  Medication Sig Dispense Refill  . Calcium-Magnesium-Vitamin D [phone] MG-MG-UNIT TABS Take 1 tablet by mouth daily.      Marland Kitchen co-enzyme Q-10 30 MG capsule Take 30 mg by mouth 3 (three) times daily.    . TURMERIC PO Take by mouth.     No current facility-administered medications on file prior to visit.   She has No Known Allergies..  Review of Systems Review of Systems  Constitutional: Negative for activity change, appetite change and fatigue.  HENT: Negative for hearing loss, congestion, tinnitus and ear discharge.  dentist q71m Eyes: Negative for visual disturbance (see optho q1y -- vision corrected to 20/20 with glasses).  Respiratory: Negative for cough, chest tightness and shortness of breath.   Cardiovascular: Negative for chest pain, palpitations and leg swelling.  Gastrointestinal: Negative for abdominal pain, diarrhea, constipation and abdominal distention.  Genitourinary: Negative for urgency, frequency, decreased urine volume and difficulty urinating.  Musculoskeletal: Negative for back pain, arthralgias  and gait problem.  Skin: Negative for color change, pallor and rash.  Neurological: Negative for dizziness, light-headedness, numbness and headaches.  Hematological: Negative for adenopathy. Does not bruise/bleed easily.  Psychiatric/Behavioral: Negative for suicidal ideas, confusion, sleep disturbance, self-injury, dysphoric mood, decreased concentration and agitation.       Objective:    BP 106/80 (BP Location: Right Arm, Patient Position: Sitting, Cuff Size: Normal)   Pulse 63   Temp 98.2 F (36.8 C) (Oral)   Resp 18   Ht 5\' 5"  (1.651 m)   Wt 154 lb 3.2 oz (69.9 kg)   LMP 06/17/2010 (Within Years)   SpO2 98%   BMI 25.66 kg/m  General appearance: alert, cooperative, appears stated age and no distress Head: Normocephalic, without obvious abnormality, atraumatic Eyes: negative findings: lids and lashes normal, conjunctivae and sclerae normal and pupils equal, round, reactive to light and accomodation Ears: normal TM's and external ear canals both ears Neck: no adenopathy, no carotid bruit, no JVD, supple, symmetrical, trachea midline and thyroid not enlarged, symmetric, no tenderness/mass/nodules Back: symmetric, no curvature. ROM normal. No CVA tenderness. Lungs: clear to auscultation bilaterally Breasts: normal appearance, no masses or tenderness Heart: regular rate and rhythm, S1, S2 normal, no murmur, click, rub or gallop Abdomen: soft, non-tender; bowel sounds normal; no masses,  no organomegaly Pelvic: not indicated; post-menopausal, no abnormal Pap smears in past Extremities: extremities normal, atraumatic, no cyanosis or edema Pulses: 2+ and symmetric Skin: Skin color, texture, turgor normal. No rashes or lesions Lymph nodes: Cervical, supraclavicular, and axillary nodes normal. Neurologic: Alert and oriented X 3, normal strength and tone. Normal symmetric reflexes. Normal coordination and gait    Assessment:    Healthy female exam.      Plan:    ghm utd Check  labs  See After Visit Summary for Counseling Recommendations   Discussed covid booster with pt -- she will consider getting booster  Pt refuses flu and pneumonia vaccine

## 2020-04-23 LAB — SARS-COV-2 ANTIBODY(IGG)SPIKE,SEMI-QUANTITATIVE: SARS COV1 AB(IGG)SPIKE,SEMI QN: 2.35 index — ABNORMAL HIGH (ref <1.00)

## 2020-05-29 DIAGNOSIS — D1801 Hemangioma of skin and subcutaneous tissue: Secondary | ICD-10-CM | POA: Diagnosis not present

## 2020-05-29 DIAGNOSIS — L821 Other seborrheic keratosis: Secondary | ICD-10-CM | POA: Diagnosis not present

## 2020-05-29 DIAGNOSIS — Z85828 Personal history of other malignant neoplasm of skin: Secondary | ICD-10-CM | POA: Diagnosis not present

## 2020-05-29 DIAGNOSIS — L814 Other melanin hyperpigmentation: Secondary | ICD-10-CM | POA: Diagnosis not present

## 2020-05-29 DIAGNOSIS — L570 Actinic keratosis: Secondary | ICD-10-CM | POA: Diagnosis not present

## 2020-06-08 DIAGNOSIS — Z1231 Encounter for screening mammogram for malignant neoplasm of breast: Secondary | ICD-10-CM | POA: Diagnosis not present

## 2020-06-08 LAB — HM MAMMOGRAPHY

## 2020-08-14 ENCOUNTER — Other Ambulatory Visit: Payer: Self-pay | Admitting: Family Medicine

## 2020-08-14 ENCOUNTER — Other Ambulatory Visit: Payer: Self-pay

## 2020-08-14 ENCOUNTER — Telehealth: Payer: Self-pay | Admitting: Family Medicine

## 2020-08-14 NOTE — Telephone Encounter (Signed)
Please advise 

## 2020-08-14 NOTE — Telephone Encounter (Signed)
While she was speaking to [employer] they mention a diagnosis of Ischemic heart disease . Patient states she was not aware of the diagnosis. Patient would like to speak to someone in regards

## 2020-08-14 NOTE — Telephone Encounter (Signed)
It may have been screening for ischemic heart dz --- that is how we can get lipid panel done but ischemic heart dz is not on our problem list

## 2020-08-16 NOTE — Telephone Encounter (Signed)
Pt called. Pt advised that ischemic heart disease was changed to screening. Pt requested proof of change. I printed out the Dx codes for the labs and place in the mail.

## 2020-12-08 ENCOUNTER — Ambulatory Visit (INDEPENDENT_AMBULATORY_CARE_PROVIDER_SITE_OTHER): Payer: Medicare Other

## 2020-12-08 VITALS — Ht 65.0 in | Wt 154.0 lb

## 2020-12-08 DIAGNOSIS — Z0000 Encounter for general adult medical examination without abnormal findings: Secondary | ICD-10-CM | POA: Diagnosis not present

## 2020-12-08 NOTE — Patient Instructions (Signed)
[name] , Thank you for taking time to complete your Medicare Wellness Visit. I appreciate your ongoing commitment to your health goals. Please review the following plan we discussed and let me know if I can assist you in the future.   Screening recommendations/referrals: Colonoscopy: Completed 07/02/2016-Due 07/02/2026 Mammogram: Completed 06/08/2020-Due 06/08/2021 Bone Density: Completed 05/20/2019-Due 05/19/2021 Recommended yearly ophthalmology/optometry visit for glaucoma screening and checkup Recommended yearly dental visit for hygiene and checkup  Vaccinations: Influenza vaccine: Declined Pneumococcal vaccine: Due-May obtain vaccine at our office or your local pharmacy Tdap vaccine: Up to date-Due 03/2028 Shingles vaccine: Completed vaccines   Covid-19:Booster due-May obtain vaccine at your local pharmacy  Advanced directives: Please bring a copy for your chart  Conditions/risks identified: See problem list  Next appointment: Follow up in one year for your annual wellness visit    Preventive Care 65 Years and Older, Female Preventive care refers to lifestyle choices and visits with your health care provider that can promote health and wellness. What does preventive care include? A yearly physical exam. This is also called an annual well check. Dental exams once or twice a year. Routine eye exams. Ask your health care provider how often you should have your eyes checked. Personal lifestyle choices, including: Daily care of your teeth and gums. Regular physical activity. Eating a healthy diet. Avoiding tobacco and drug use. Limiting alcohol use. Practicing safe sex. Taking low-dose aspirin every day. Taking vitamin and mineral supplements as recommended by your health care provider. What happens during an annual well check? The services and screenings done by your health care provider during your annual well check will depend on your age, overall health, lifestyle risk  factors, and family history of disease. Counseling  Your health care provider may ask you questions about your: Alcohol use. Tobacco use. Drug use. Emotional well-being. Home and relationship well-being. Sexual activity. Eating habits. History of falls. Memory and ability to understand (cognition). Work and work Statistician. Reproductive health. Screening  You may have the following tests or measurements: Height, weight, and BMI. Blood pressure. Lipid and cholesterol levels. These may be checked every 5 years, or more frequently if you are over 72 years old. Skin check. Lung cancer screening. You may have this screening every year starting at age 74 if you have a 30-pack-year history of smoking and currently smoke or have quit within the past 15 years. Fecal occult blood test (FOBT) of the stool. You may have this test every year starting at age 11. Flexible sigmoidoscopy or colonoscopy. You may have a sigmoidoscopy every 5 years or a colonoscopy every 10 years starting at age 34. Hepatitis C blood test. Hepatitis B blood test. Sexually transmitted disease (STD) testing. Diabetes screening. This is done by checking your blood sugar (glucose) after you have not eaten for a while (fasting). You may have this done every 1-3 years. Bone density scan. This is done to screen for osteoporosis. You may have this done starting at age 39. Mammogram. This may be done every 1-2 years. Talk to your health care provider about how often you should have regular mammograms. Talk with your health care provider about your test results, treatment options, and if necessary, the need for more tests. Vaccines  Your health care provider may recommend certain vaccines, such as: Influenza vaccine. This is recommended every year. Tetanus, diphtheria, and acellular pertussis (Tdap, Td) vaccine. You may need a Td booster every 10 years. Zoster vaccine. You may need this after age 49. Pneumococcal 13-valent  conjugate (PCV13) vaccine. One dose is recommended after age 40. Pneumococcal polysaccharide (PPSV23) vaccine. One dose is recommended after age 37. Talk to your health care provider about which screenings and vaccines you need and how often you need them. This information is not intended to replace advice given to you by your health care provider. Make sure you discuss any questions you have with your health care provider. Document Released: 06/30/2015 Document Revised: 02/21/2016 Document Reviewed: 04/04/2015 Elsevier Interactive Patient Education  2017 [place] Prevention in the Home Falls can cause injuries. They can happen to people of all ages. There are many things you can do to make your home safe and to help prevent falls. What can I do on the outside of my home? Regularly fix the edges of walkways and driveways and fix any cracks. Remove anything that might make you trip as you walk through a door, such as a raised step or threshold. Trim any bushes or trees on the path to your home. Use bright outdoor lighting. Clear any walking paths of anything that might make someone trip, such as rocks or tools. Regularly check to see if handrails are loose or broken. Make sure that both sides of any steps have handrails. Any raised decks and porches should have guardrails on the edges. Have any leaves, snow, or ice cleared regularly. Use sand or salt on walking paths during winter. Clean up any spills in your garage right away. This includes oil or grease spills. What can I do in the bathroom? Use night lights. Install grab bars by the toilet and in the tub and shower. Do not use towel bars as grab bars. Use non-skid mats or decals in the tub or shower. If you need to sit down in the shower, use a plastic, non-slip stool. Keep the floor dry. Clean up any water that spills on the floor as soon as it happens. Remove soap buildup in the tub or shower regularly. Attach bath mats  securely with double-sided non-slip rug tape. Do not have throw rugs and other things on the floor that can make you trip. What can I do in the bedroom? Use night lights. Make sure that you have a light by your bed that is easy to reach. Do not use any sheets or blankets that are too big for your bed. They should not hang down onto the floor. Have a firm chair that has side arms. You can use this for support while you get dressed. Do not have throw rugs and other things on the floor that can make you trip. What can I do in the kitchen? Clean up any spills right away. Avoid walking on wet floors. Keep items that you use a lot in easy-to-reach places. If you need to reach something above you, use a strong step stool that has a grab bar. Keep electrical cords out of the way. Do not use floor polish or wax that makes floors slippery. If you must use wax, use non-skid floor wax. Do not have throw rugs and other things on the floor that can make you trip. What can I do with my stairs? Do not leave any items on the stairs. Make sure that there are handrails on both sides of the stairs and use them. Fix handrails that are broken or loose. Make sure that handrails are as long as the stairways. Check any carpeting to make sure that it is firmly attached to the stairs. Fix any carpet that is  loose or worn. Avoid having throw rugs at the top or bottom of the stairs. If you do have throw rugs, attach them to the floor with carpet tape. Make sure that you have a light switch at the top of the stairs and the bottom of the stairs. If you do not have them, ask someone to add them for you. What else can I do to help prevent falls? Wear shoes that: Do not have high heels. Have rubber bottoms. Are comfortable and fit you well. Are closed at the toe. Do not wear sandals. If you use a stepladder: Make sure that it is fully opened. Do not climb a closed stepladder. Make sure that both sides of the stepladder  are locked into place. Ask someone to hold it for you, if possible. Clearly mark and make sure that you can see: Any grab bars or handrails. First and last steps. Where the edge of each step is. Use tools that help you move around (mobility aids) if they are needed. These include: Canes. Walkers. Scooters. Crutches. Turn on the lights when you go into a dark area. Replace any light bulbs as soon as they burn out. Set up your furniture so you have a clear path. Avoid moving your furniture around. If any of your floors are uneven, fix them. If there are any pets around you, be aware of where they are. Review your medicines with your doctor. Some medicines can make you feel dizzy. This can increase your chance of falling. Ask your doctor what other things that you can do to help prevent falls. This information is not intended to replace advice given to you by your health care provider. Make sure you discuss any questions you have with your health care provider. Document Released: 03/30/2009 Document Revised: 11/09/2015 Document Reviewed: 07/08/2014 Elsevier Interactive Patient Education  2017 [employer].

## 2020-12-08 NOTE — Progress Notes (Signed)
Subjective:   Paige Waters is a 70 y.o. female who presents for Medicare Annual (Subsequent) preventive examination.  I connected with Paige Waters today by telephone and verified that I am speaking with the correct person using two identifiers. Location patient: home Location provider: work Persons participating in the virtual visit: patient, Marine scientist.    I discussed the limitations, risks, security and privacy concerns of performing an evaluation and management service by telephone and the availability of in person appointments. I also discussed with the patient that there may be a patient responsible charge related to this service. The patient expressed understanding and verbally consented to this telephonic visit.    Interactive audio and video telecommunications were attempted between this provider and patient, however failed, due to patient having technical difficulties OR patient did not have access to video capability.  We continued and completed visit with audio only.  Some vital signs may be absent or patient reported.   Time Spent with patient on telephone encounter: 20 minutes   Review of Systems     Cardiac Risk Factors include: advanced age (>25men, >23 women);dyslipidemia     Objective:    Today's Vitals   12/08/20 0859  Weight: 154 lb (69.9 kg)  Height: 5\' 5"  (1.651 m)  PainSc: 2    Body mass index is 25.63 kg/m.  Advanced Directives 12/08/2020 12/02/2019  Does Patient Have a Medical Advance Directive? Yes No  Type of Paramedic of [place];Living will -  Does patient want to make changes to medical advance directive? - No - Patient declined  Copy of [place] in Chart? No - copy requested -    Current Medications (verified) Outpatient Encounter Medications as of 12/08/2020  Medication Sig   Calcium-Magnesium-Vitamin D [phone] MG-MG-UNIT TABS Take 1 tablet by mouth daily.     co-enzyme Q-10 30 MG capsule Take 30 mg by  mouth 3 (three) times daily.   TURMERIC PO Take by mouth.   No facility-administered encounter medications on file as of 12/08/2020.    Allergies (verified) Patient has no known allergies.   History: Past Medical History:  Diagnosis Date   Colon polyps    Hyperlipidemia    Osteopenia    Thyroid nodule    Uterine hyperplasia    Past Surgical History:  Procedure Laterality Date   COLONOSCOPY  2007   LAPAROSCOPY     Family History  Problem Relation Age of Onset   Coronary artery disease Father 54   Heart disease Father 97       MI---2nd one at 27   Heart attack Father    Hyperlipidemia Father    Osteoporosis Mother    Colon polyps Brother    Kidney disease Brother        dialysis   Hyperlipidemia Brother    Diabetes Neg Hx    Hypertension Neg Hx    Sudden death Neg Hx    Colon cancer Neg Hx    Social History   Socioeconomic History   Marital status: Widowed    Spouse name: Not on file   Number of children: Not on file   Years of education: Not on file   Highest education level: Not on file  Occupational History   Not on file  Tobacco Use   Smoking status: Former    Pack years: 0.00    Types: Cigarettes    Quit date: 06/17/1980    Years since quitting: 40.5   Smokeless tobacco:  Never  Substance and Sexual Activity   Alcohol use: Yes    Comment: occas   Drug use: No   Sexual activity: Yes    Partners: Male  Other Topics Concern   Not on file  Social History Narrative   Not on file   Social Determinants of Health   Financial Resource Strain: Low Risk    Difficulty of Paying Living Expenses: Not hard at all  Food Insecurity: No Food Insecurity   Worried About Charity fundraiser in the Last Year: Never true   [place] in the Last Year: Never true  Transportation Needs: No Transportation Needs   Lack of Transportation (Medical): No   Lack of Transportation (Non-Medical): No  Physical Activity: Sufficiently Active   Days of Exercise per Week:  4 days   Minutes of Exercise per Session: 40 min  Stress: No Stress Concern Present   Feeling of Stress : Not at all  Social Connections: Moderately Integrated   Frequency of Communication with Friends and Family: More than three times a week   Frequency of Social Gatherings with Friends and Family: More than three times a week   Attends Religious Services: More than 4 times per year   Active Member of [employer] or Organizations: Yes   Attends Archivist Meetings: More than 4 times per year   Marital Status: Widowed    Tobacco Counseling Counseling given: Not Answered   Clinical Intake:  Pre-visit preparation completed: Yes  Pain : 0-10 Pain Score: 2  Pain Type: Chronic pain Pain Location: Heel Pain Orientation: Right Pain Onset: More than a month ago Pain Frequency: Constant     Nutritional Risks: None Diabetes: No  How often do you need to have someone help you when you read instructions, pamphlets, or other written materials from your doctor or pharmacy?: 1 - Never  Diabetic?No  Interpreter Needed?: No  Information entered by :: Caroleen Hamman LPN   Activities of Daily Living In your present state of health, do you have any difficulty performing the following activities: 12/08/2020  Hearing? N  Vision? N  Difficulty concentrating or making decisions? N  Walking or climbing stairs? N  Dressing or bathing? N  Doing errands, shopping? N  Preparing Food and eating ? N  Using the Toilet? N  In the past six months, have you accidently leaked urine? N  Do you have problems with loss of bowel control? N  Managing your Medications? N  Managing your Finances? N  Housekeeping or managing your Housekeeping? N  Some recent data might be hidden    Patient Care Team: Carollee Herter, Alferd Apa, DO as PCP - General (Family Medicine) Carollee Herter, Alferd Apa, DO as PCP - Family Medicine Griselda Miner, MD as Consulting Physician (Dermatology) Ulla Gallo, MD as  Consulting Physician (Dermatology)  Indicate any recent Medical Services you may have received from other than [facility] providers in the past year (date may be approximate).     Assessment:   This is a routine wellness examination for Paige Waters.  Hearing/Vision screen Hearing Screening - Comments:: No issues Vision Screening - Comments:: Last eye exam-2021-[facility] Wears glasses  Dietary issues and exercise activities discussed: Current Exercise Habits: Home exercise routine, Type of exercise: walking;strength training/weights;Other - see comments (stationary bike), Time (Minutes): 30, Frequency (Times/Week): 4, Weekly Exercise (Minutes/Week): 120, Intensity: Mild, Exercise limited by: None identified   Goals Addressed  This Visit's Progress    Continue exercising and eating a healthy diet.   On track      Depression Screen PHQ 2/9 Scores 12/08/2020 12/02/2019 04/08/2017 05/06/2013  PHQ - 2 Score 0 0 0 0    Fall Risk Fall Risk  12/08/2020 12/02/2019 04/08/2017  Falls in the past year? 0 0 No  Number falls in past yr: 0 0 -  Injury with Fall? 0 0 -  Follow up Falls prevention discussed Education provided;Falls prevention discussed -    FALL RISK PREVENTION PERTAINING TO THE HOME:  Any stairs in or around the home? Yes  If so, are there any without handrails? No  Home free of loose throw rugs in walkways, pet beds, electrical cords, etc? Yes  Adequate lighting in your home to reduce risk of falls? Yes   ASSISTIVE DEVICES UTILIZED TO PREVENT FALLS:  Life alert? No  Use of a cane, walker or w/c? No  Grab bars in the bathroom? No  Shower chair or bench in shower? Yes  Elevated toilet seat or a handicapped toilet? No   TIMED UP AND GO:  Was the test performed? No . Phone visit   Cognitive Function:Normal cognitive status assessed by this Nurse Health Advisor. No abnormalities found.          Immunizations Immunization History  Administered Date(s)  Administered   Janssen (J&J) SARS-COV-2 Vaccination 08/27/2019   Td 05/07/2007   Tdap 04/16/2018   Zoster Recombinat (Shingrix) 06/11/2018, 08/13/2018   Zoster, Live 05/06/2013    TDAP status: Up to date  Flu Vaccine status: Declined, Education has been provided regarding the importance of this vaccine but patient still declined. Advised may receive this vaccine at local pharmacy or Health Dept. Aware to provide a copy of the vaccination record if obtained from local pharmacy or Health Dept. Verbalized acceptance and understanding.  Pneumococcal vaccine status: Due, Education has been provided regarding the importance of this vaccine. Advised may receive this vaccine at local pharmacy or Health Dept. Aware to provide a copy of the vaccination record if obtained from local pharmacy or Health Dept. Verbalized acceptance and understanding.  Covid-19 vaccine status: Information provided on how to obtain vaccines. Booster due  Qualifies for Shingles Vaccine? No   Zostavax completed Yes   Shingrix Completed?: Yes  Screening Tests Health Maintenance  Topic Date Due   COVID-19 Vaccine (2 - Janssen risk series) 09/24/2019   PNA vac Low Risk Adult (1 of 2 - PCV13) 04/21/2021 (Originally 06/16/2017)   INFLUENZA VACCINE  01/15/2021   DEXA SCAN  05/19/2021   MAMMOGRAM  06/08/2021   COLONOSCOPY (Pts 45-24yrs Insurance coverage will need to be confirmed)  07/02/2026   TETANUS/TDAP  04/16/2028   Hepatitis C Screening  Completed   Zoster Vaccines- Shingrix  Completed   HPV VACCINES  Aged Out    Health Maintenance  Health Maintenance Due  Topic Date Due   COVID-19 Vaccine (2 - Janssen risk series) 09/24/2019    Colorectal cancer screening: Type of screening: Colonoscopy. Completed 07/02/2016. Repeat every 10 years  Mammogram status: Completed 06/08/2020. Repeat every year  Bone Density status: Completed 05/20/2019. Results reflect: Bone density results: OSTEOPENIA. Repeat every 2  years.  Lung Cancer Screening: (Low Dose CT Chest recommended if Age 86-80 years, 30 pack-year currently smoking OR have quit w/in 15years.) does not qualify.    Additional Screening:  Hepatitis C Screening:  Completed 02/08/2016  Vision Screening: Recommended annual ophthalmology exams for early detection of glaucoma  and other disorders of the eye. Is the patient up to date with their annual eye exam?  No  Who is the provider or what is the name of the office in which the patient attends annual eye exams? [facility] Patient advised to schedule an appt  Dental Screening: Recommended annual dental exams for proper oral hygiene  Community Resource Referral / Chronic Care Management: CRR required this visit?  No   CCM required this visit?  No      Plan:     I have personally reviewed and noted the following in the patient's chart:   Medical and social history Use of alcohol, tobacco or illicit drugs  Current medications and supplements including opioid prescriptions.  Functional ability and status Nutritional status Physical activity Advanced directives List of other physicians Hospitalizations, surgeries, and ER visits in previous 12 months Vitals Screenings to include cognitive, depression, and falls Referrals and appointments  In addition, I have reviewed and discussed with patient certain preventive protocols, quality metrics, and best practice recommendations. A written personalized care plan for preventive services as well as general preventive health recommendations were provided to patient.   Due to this being a telephonic visit, the after visit summary with patients personalized plan was offered to patient via mail or my-chart. Patient preferred to pick up at office at next visit.   Marta Antu, LPN   [id number]  Nurse Health Advisor  Nurse Notes: None

## 2021-04-25 ENCOUNTER — Other Ambulatory Visit: Payer: Self-pay

## 2021-04-26 ENCOUNTER — Ambulatory Visit (INDEPENDENT_AMBULATORY_CARE_PROVIDER_SITE_OTHER): Payer: Medicare Other | Admitting: Family Medicine

## 2021-04-26 ENCOUNTER — Encounter: Payer: Self-pay | Admitting: Family Medicine

## 2021-04-26 VITALS — BP 102/80 | HR 69 | Temp 98.8°F | Resp 16 | Ht 65.0 in | Wt 146.4 lb

## 2021-04-26 DIAGNOSIS — Z0000 Encounter for general adult medical examination without abnormal findings: Secondary | ICD-10-CM | POA: Diagnosis not present

## 2021-04-26 DIAGNOSIS — E785 Hyperlipidemia, unspecified: Secondary | ICD-10-CM | POA: Diagnosis not present

## 2021-04-26 DIAGNOSIS — E2839 Other primary ovarian failure: Secondary | ICD-10-CM | POA: Diagnosis not present

## 2021-04-26 DIAGNOSIS — Z136 Encounter for screening for cardiovascular disorders: Secondary | ICD-10-CM | POA: Diagnosis not present

## 2021-04-26 LAB — LIPID PANEL
Cholesterol: 218 mg/dL — ABNORMAL HIGH (ref 0–200)
HDL: 57 mg/dL (ref >39.00)
LDL Cholesterol: 146 mg/dL — ABNORMAL HIGH (ref 0–99)
NonHDL: 160.6
Total CHOL/HDL Ratio: 4
Triglycerides: 72 mg/dL (ref 0.0–149.0)
VLDL: 14.4 mg/dL (ref 0.0–40.0)

## 2021-04-26 LAB — CBC WITH DIFFERENTIAL/PLATELET
Basophils Absolute: 0 10*3/uL (ref 0.0–0.1)
Basophils Relative: 1 % (ref 0.0–3.0)
Eosinophils Absolute: 0.3 10*3/uL (ref 0.0–0.7)
Eosinophils Relative: 6.6 % — ABNORMAL HIGH (ref 0.0–5.0)
HCT: 40.4 % (ref 36.0–46.0)
Hemoglobin: 13.3 g/dL (ref 12.0–15.0)
Lymphocytes Relative: 32.4 % (ref 12.0–46.0)
Lymphs Abs: 1.4 10*3/uL (ref 0.7–4.0)
MCHC: 32.9 g/dL (ref 30.0–36.0)
MCV: 90.8 fl (ref 78.0–100.0)
Monocytes Absolute: 0.4 10*3/uL (ref 0.1–1.0)
Monocytes Relative: 9 % (ref 3.0–12.0)
Neutro Abs: 2.2 10*3/uL (ref 1.4–7.7)
Neutrophils Relative %: 51 % (ref 43.0–77.0)
Platelets: 180 10*3/uL (ref 150.0–400.0)
RBC: 4.45 Mil/uL (ref 3.87–5.11)
RDW: 13 % (ref 11.5–15.5)
WBC: 4.2 10*3/uL (ref 4.0–10.5)

## 2021-04-26 LAB — COMPREHENSIVE METABOLIC PANEL
ALT: 13 U/L (ref 0–35)
AST: 15 U/L (ref 0–37)
Albumin: 4.2 g/dL (ref 3.5–5.2)
Alkaline Phosphatase: 83 U/L (ref 39–117)
BUN: 10 mg/dL (ref 6–23)
CO2: 31 mEq/L (ref 19–32)
Calcium: 9.5 mg/dL (ref 8.4–10.5)
Chloride: 102 mEq/L (ref 96–112)
Creatinine, Ser: 0.67 mg/dL (ref 0.40–1.20)
GFR: 89.53 mL/min (ref >60.00)
Glucose, Bld: 94 mg/dL (ref 70–99)
Potassium: 4.9 mEq/L (ref 3.5–5.1)
Sodium: 139 mEq/L (ref 135–145)
Total Bilirubin: 0.5 mg/dL (ref 0.2–1.2)
Total Protein: 6.5 g/dL (ref 6.0–8.3)

## 2021-04-26 NOTE — Assessment & Plan Note (Signed)
ghm utd Check labs  See avs  

## 2021-04-26 NOTE — Assessment & Plan Note (Signed)
Encourage heart healthy diet such as MIND or DASH diet, increase exercise, avoid trans fats, simple carbohydrates and processed foods, consider a krill or fish or flaxseed oil cap daily.  °

## 2021-04-26 NOTE — Progress Notes (Signed)
Subjective:   By signing my name below, I, Shehryar Baig, attest that this documentation has been prepared under the direction and in the presence of Dr. Roma Schanz, DO. 04/26/2021    Patient ID: Paige Waters, female    DOB: February 23, 1951, 70 y.o.   MRN: [id number]  Chief Complaint  Patient presents with   Annual Exam    Pt states fasting     HPI Patient is in today for a comprehensive physical exam.   Dermatologist- She continues seeing a dermatologist specialist regularly.   CPE She denies having any fever, new moles, congestion, sore throat, new muscle pain, new joint pain, chest pain, cough, SOB, wheezing, n/v/d, constipation, blood in stool, dysuria, frequency, hematuria, or headaches at this time. Social history- She has no recent changes to her family medical history. She has no recent surgical procedures in the past year. She continues working part time at [employer]. Immunizations- She is not interested in receiving the flu vaccine. She has 1 johnson and [employer] vaccine and is not interested in receiving any booster vaccines. She has never received the pneumonia vaccine and is not interested in receiving it at this time.  Exercise- She typically exercises regularly but has not worked out in the past week due to a mild cold. She completes 30-40 minutes of cardio exercise, some resistance training, and 45 minutes to 1 hour of yoga during her exercise.  Vision- She is UTD on vision care.  Dental- She is UTD on dental care.    Past Medical History:  Diagnosis Date   Colon polyps    Hyperlipidemia    Osteopenia    Thyroid nodule    Uterine hyperplasia     Past Surgical History:  Procedure Laterality Date   COLONOSCOPY  2007   LAPAROSCOPY      Family History  Problem Relation Age of Onset   Coronary artery disease Father 64   Heart disease Father 28       MI---2nd one at 23   Heart attack Father    Hyperlipidemia Father    Osteoporosis  Mother    Colon polyps Brother    Kidney disease Brother        dialysis   Hyperlipidemia Brother    Diabetes Neg Hx    Hypertension Neg Hx    Sudden death Neg Hx    Colon cancer Neg Hx     Social History   Socioeconomic History   Marital status: Widowed    Spouse name: Not on file   Number of children: Not on file   Years of education: Not on file   Highest education level: Not on file  Occupational History   Occupation: [employer]  Tobacco Use   Smoking status: Former    Types: Cigarettes    Quit date: 06/17/1980    Years since quitting: 40.8   Smokeless tobacco: Never  Substance and Sexual Activity   Alcohol use: Yes    Comment: occas   Drug use: No   Sexual activity: Yes    Partners: Male  Other Topics Concern   Not on file  Social History Narrative   Not on file   Social Determinants of Health   Financial Resource Strain: Low Risk    Difficulty of Paying Living Expenses: Not hard at all  Food Insecurity: No Food Insecurity   Worried About Charity fundraiser in the Last Year: Never true   Arboriculturist in  the Last Year: Never true  Transportation Needs: No Transportation Needs   Lack of Transportation (Medical): No   Lack of Transportation (Non-Medical): No  Physical Activity: Sufficiently Active   Days of Exercise per Week: 4 days   Minutes of Exercise per Session: 40 min  Stress: No Stress Concern Present   Feeling of Stress : Not at all  Social Connections: Moderately Integrated   Frequency of Communication with Friends and Family: More than three times a week   Frequency of Social Gatherings with Friends and Family: More than three times a week   Attends Religious Services: More than 4 times per year   Active Member of [employer] or Organizations: Yes   Attends Archivist Meetings: More than 4 times per year   Marital Status: Widowed  Human resources officer Violence: Not At Risk   Fear of Current or Ex-Partner: No   Emotionally Abused: No    Physically Abused: No   Sexually Abused: No    Outpatient Medications Prior to Visit  Medication Sig Dispense Refill   Calcium-Magnesium-Vitamin D [phone] MG-MG-UNIT TABS Take 1 tablet by mouth daily.       co-enzyme Q-10 30 MG capsule Take 30 mg by mouth 3 (three) times daily.     TURMERIC PO Take by mouth.     No facility-administered medications prior to visit.    No Known Allergies  Review of Systems  Constitutional:  Negative for fever and malaise/fatigue.  HENT:  Negative for congestion and sore throat.   Eyes:  Negative for blurred vision.  Respiratory:  Negative for cough, shortness of breath and wheezing.   Cardiovascular:  Negative for chest pain, palpitations and leg swelling.  Gastrointestinal:  Negative for blood in stool, constipation, diarrhea, nausea and vomiting.  Genitourinary:  Negative for dysuria, frequency and hematuria.  Musculoskeletal:  Negative for back pain, joint pain and myalgias.  Skin:  Negative for rash.       (-)New moles  Neurological:  Negative for loss of consciousness and headaches.      Objective:    Physical Exam Vitals and nursing note reviewed.  Constitutional:      General: She is not in acute distress.    Appearance: Normal appearance. She is well-developed. She is not ill-appearing.  HENT:     Head: Normocephalic and atraumatic.     Right Ear: Tympanic membrane, ear canal and external ear normal.     Left Ear: Tympanic membrane, ear canal and external ear normal.     Nose: Nose normal.  Eyes:     Extraocular Movements: Extraocular movements intact.     Pupils: Pupils are equal, round, and reactive to light.  Cardiovascular:     Rate and Rhythm: Normal rate and regular rhythm.     Heart sounds: Normal heart sounds. No murmur heard.   No gallop.  Pulmonary:     Effort: Pulmonary effort is normal. No respiratory distress.     Breath sounds: Normal breath sounds. No wheezing or rales.  Chest:     Chest wall: No  tenderness.  Abdominal:     General: Bowel sounds are normal. There is no distension.     Palpations: Abdomen is soft.     Tenderness: There is no abdominal tenderness. There is no guarding.  Musculoskeletal:        General: Normal range of motion.     Cervical back: Normal range of motion and neck supple.  Skin:    General: Skin is  warm and dry.  Neurological:     General: No focal deficit present.     Mental Status: She is alert and oriented to person, place, and time.  Psychiatric:        Behavior: Behavior normal.        Thought Content: Thought content normal.        Judgment: Judgment normal.    BP 102/80 (BP Location: Right Arm, Patient Position: Sitting, Cuff Size: Normal)   Pulse 69   Temp 98.8 F (37.1 C) (Oral)   Resp 16   Ht 5\' 5"  (1.651 m)   Wt 146 lb 6.4 oz (66.4 kg)   LMP 06/17/2010 (Within Years)   SpO2 97%   BMI 24.36 kg/m  Wt Readings from Last 3 Encounters:  04/26/21 146 lb 6.4 oz (66.4 kg)  12/08/20 154 lb (69.9 kg)  04/21/20 154 lb 3.2 oz (69.9 kg)    Diabetic Foot Exam - Simple   No data filed    Lab Results  Component Value Date   WBC 5.2 04/21/2020   HGB 14.1 04/21/2020   HCT 41.6 04/21/2020   PLT 200 04/21/2020   GLUCOSE 89 04/21/2020   CHOL 217 (H) 04/21/2020   TRIG 98 04/21/2020   HDL 71 04/21/2020   LDLDIRECT 141.2 05/06/2013   LDLCALC 126 (H) 04/21/2020   ALT 16 04/21/2020   AST 12 04/21/2020   NA 142 04/21/2020   K 4.7 04/21/2020   CL 107 04/21/2020   CREATININE 0.68 04/21/2020   BUN 13 04/21/2020   CO2 29 04/21/2020   TSH 3.36 04/19/2019    Lab Results  Component Value Date   TSH 3.36 04/19/2019   Lab Results  Component Value Date   WBC 5.2 04/21/2020   HGB 14.1 04/21/2020   HCT 41.6 04/21/2020   MCV 91.4 04/21/2020   PLT 200 04/21/2020   Lab Results  Component Value Date   NA 142 04/21/2020   K 4.7 04/21/2020   CO2 29 04/21/2020   GLUCOSE 89 04/21/2020   BUN 13 04/21/2020   CREATININE 0.68 04/21/2020    BILITOT 0.5 04/21/2020   ALKPHOS 89 04/19/2019   AST 12 04/21/2020   ALT 16 04/21/2020   PROT 6.6 04/21/2020   ALBUMIN 4.1 04/19/2019   CALCIUM 9.8 04/21/2020   GFR 89.37 04/19/2019   Lab Results  Component Value Date   CHOL 217 (H) 04/21/2020   Lab Results  Component Value Date   HDL 71 04/21/2020   Lab Results  Component Value Date   LDLCALC 126 (H) 04/21/2020   Lab Results  Component Value Date   TRIG 98 04/21/2020   Lab Results  Component Value Date   CHOLHDL 3.1 04/21/2020   No results found for: HGBA1C  Mammogram- Last completed 08/28/2020. Results are normal. Repeat in 1 year.  Dexa- Last completed 07/21/2019. Results showed she has osteopenia. Repeat in 2 years.  Colonoscopy- Last competed 07/02/2016. Results showed two 3-4 mm polyps in the rectum which was removed, non-bleeding internal hemorrhoids, otherwise results are normal. Repeat in 5-10 years.  Pap Smear- Last completed 04/08/2017. Results are normal.     Assessment & Plan:   Problem List Items Addressed This Visit       Unprioritized   Hyperlipidemia    Encourage heart healthy diet such as MIND or DASH diet, increase exercise, avoid trans fats, simple carbohydrates and processed foods, consider a krill or fish or flaxseed oil cap daily.  Preventative health care - Primary    ghm utd Check labs  See avs      Other Visit Diagnoses     Estrogen deficiency       Relevant Orders   DG Bone Density   CBC with Differential/Platelet   Comprehensive metabolic panel   Ischemic heart disease screen       Relevant Orders   CBC with Differential/Platelet   Lipid panel   Comprehensive metabolic panel        No orders of the defined types were placed in this encounter.   I, Dr. Roma Schanz, DO, personally preformed the services described in this documentation.  All medical record entries made by the scribe were at my direction and in my presence.  I have reviewed the chart and  discharge instructions (if applicable) and agree that the record reflects my personal performance and is accurate and complete. 04/26/2021   I,Shehryar Baig,acting as a scribe for Ann Held, DO.,have documented all relevant documentation on the behalf of Ann Held, DO,as directed by  Ann Held, DO while in the presence of Ann Held, DO.   Ann Held, DO

## 2021-04-26 NOTE — Patient Instructions (Signed)
Preventive Care 40 Years and Older, Female Preventive care refers to lifestyle choices and visits with your health care provider that can promote health and wellness. Preventive care visits are also called wellness exams. What can I expect for my preventive care visit? Counseling Your health care provider may ask you questions about your: Medical history, including: Past medical problems. Family medical history. Pregnancy and menstrual history. History of falls. Current health, including: Memory and ability to understand (cognition). Emotional well-being. Home life and relationship well-being. Sexual activity and sexual health. Lifestyle, including: Alcohol, nicotine or tobacco, and drug use. Access to firearms. Diet, exercise, and sleep habits. Work and work Statistician. Sunscreen use. Safety issues such as seatbelt and bike helmet use. Physical exam Your health care provider will check your: Height and weight. These may be used to calculate your BMI (body mass index). BMI is a measurement that tells if you are at a healthy weight. Waist circumference. This measures the distance around your waistline. This measurement also tells if you are at a healthy weight and may help predict your risk of certain diseases, such as type 2 diabetes and high blood pressure. Heart rate and blood pressure. Body temperature. Skin for abnormal spots. What immunizations do I need? Vaccines are usually given at various ages, according to a schedule. Your health care provider will recommend vaccines for you based on your age, medical history, and lifestyle or other factors, such as travel or where you work. What tests do I need? Screening Your health care provider may recommend screening tests for certain conditions. This may include: Lipid and cholesterol levels. Hepatitis C test. Hepatitis B test. HIV (human immunodeficiency virus) test. STI (sexually transmitted infection) testing, if you are at  risk. Lung cancer screening. Colorectal cancer screening. Diabetes screening. This is done by checking your blood sugar (glucose) after you have not eaten for a while (fasting). Mammogram. Talk with your health care provider about how often you should have regular mammograms. BRCA-related cancer screening. This may be done if you have a family history of breast, ovarian, tubal, or peritoneal cancers. Bone density scan. This is done to screen for osteoporosis. Talk with your health care provider about your test results, treatment options, and if necessary, the need for more tests. Follow these instructions at home: Eating and drinking  Eat a diet that includes fresh fruits and vegetables, whole grains, lean protein, and low-fat dairy products. Limit your intake of foods with high amounts of sugar, saturated fats, and salt. Take vitamin and mineral supplements as recommended by your health care provider. Do not drink alcohol if your health care provider tells you not to drink. If you drink alcohol: Limit how much you have to 0-1 drink a day. Know how much alcohol is in your drink. In the [place] one drink equals one 12 oz bottle of beer (355 mL), one 5 oz glass of wine (148 mL), or one 1 oz glass of hard liquor (44 mL). Lifestyle Brush your teeth every morning and night with fluoride toothpaste. Floss one time each day. Exercise for at least 30 minutes 5 or more days each week. Do not use any products that contain nicotine or tobacco. These products include cigarettes, chewing tobacco, and vaping devices, such as e-cigarettes. If you need help quitting, ask your health care provider. Do not use drugs. If you are sexually active, practice safe sex. Use a condom or other form of protection in order to prevent STIs. Take aspirin only as told by your  health care provider. Make sure that you understand how much to take and what form to take. Work with your health care provider to find out whether it  is safe and beneficial for you to take aspirin daily. Ask your health care provider if you need to take a cholesterol-lowering medicine (statin). Find healthy ways to manage stress, such as: Meditation, yoga, or listening to music. Journaling. Talking to a trusted person. Spending time with friends and family. Minimize exposure to UV radiation to reduce your risk of skin cancer. Safety Always wear your seat belt while driving or riding in a vehicle. Do not drive: If you have been drinking alcohol. Do not ride with someone who has been drinking. When you are tired or distracted. While texting. If you have been using any mind-altering substances or drugs. Wear a helmet and other protective equipment during sports activities. If you have firearms in your house, make sure you follow all gun safety procedures. What's next? Visit your health care provider once a year for an annual wellness visit. Ask your health care provider how often you should have your eyes and teeth checked. Stay up to date on all vaccines. This information is not intended to replace advice given to you by your health care provider. Make sure you discuss any questions you have with your health care provider. Document Revised: 11/29/2020 Document Reviewed: 11/29/2020 Elsevier Patient Education  [place].

## 2021-05-21 DIAGNOSIS — L814 Other melanin hyperpigmentation: Secondary | ICD-10-CM | POA: Diagnosis not present

## 2021-05-21 DIAGNOSIS — Z85828 Personal history of other malignant neoplasm of skin: Secondary | ICD-10-CM | POA: Diagnosis not present

## 2021-05-21 DIAGNOSIS — C44722 Squamous cell carcinoma of skin of right lower limb, including hip: Secondary | ICD-10-CM | POA: Diagnosis not present

## 2021-05-21 DIAGNOSIS — D1801 Hemangioma of skin and subcutaneous tissue: Secondary | ICD-10-CM | POA: Diagnosis not present

## 2021-05-21 DIAGNOSIS — L821 Other seborrheic keratosis: Secondary | ICD-10-CM | POA: Diagnosis not present

## 2021-05-21 DIAGNOSIS — C44729 Squamous cell carcinoma of skin of left lower limb, including hip: Secondary | ICD-10-CM | POA: Diagnosis not present

## 2021-05-21 DIAGNOSIS — L720 Epidermal cyst: Secondary | ICD-10-CM | POA: Diagnosis not present

## 2021-06-25 DIAGNOSIS — M8589 Other specified disorders of bone density and structure, multiple sites: Secondary | ICD-10-CM | POA: Diagnosis not present

## 2021-06-25 DIAGNOSIS — Z1231 Encounter for screening mammogram for malignant neoplasm of breast: Secondary | ICD-10-CM | POA: Diagnosis not present

## 2021-06-25 LAB — HM DEXA SCAN

## 2021-06-25 LAB — HM MAMMOGRAPHY

## 2021-06-26 ENCOUNTER — Encounter: Payer: Self-pay | Admitting: *Deleted

## 2021-11-06 ENCOUNTER — Ambulatory Visit (HOSPITAL_BASED_OUTPATIENT_CLINIC_OR_DEPARTMENT_OTHER)
Admission: RE | Admit: 2021-11-06 | Discharge: 2021-11-06 | Disposition: A | Discharge status: Home / Self Care | Payer: Medicare Other | Source: Ambulatory Visit | Attending: Family Medicine | Admitting: Family Medicine

## 2021-11-06 ENCOUNTER — Encounter: Payer: Self-pay | Admitting: Family Medicine

## 2021-11-06 ENCOUNTER — Ambulatory Visit (INDEPENDENT_AMBULATORY_CARE_PROVIDER_SITE_OTHER): Payer: Medicare Other | Admitting: Family Medicine

## 2021-11-06 VITALS — BP 129/48 | HR 73 | Temp 98.2°F | Resp 16 | Wt 145.0 lb

## 2021-11-06 DIAGNOSIS — M79674 Pain in right toe(s): Secondary | ICD-10-CM | POA: Diagnosis not present

## 2021-11-06 DIAGNOSIS — M79672 Pain in left foot: Secondary | ICD-10-CM | POA: Diagnosis not present

## 2021-11-06 NOTE — Patient Instructions (Signed)
Foot Sprain  A foot sprain is an injury to one of the ligaments in the feet. Ligaments are strong tissues that connect bones to each other. The ligament can be stretched too much. In some cases, it may tear. A tear can be either partial or complete. The severity of the sprain depends on how much of the ligament was damaged or torn. What are the causes? This condition is usually caused by suddenly twisting or pivoting your foot. What increases the risk? You are more likely to develop this condition if: You play a sport, such as basketball or football. You exercise or play a sport without first warming up your muscles. You start a new workout or sport. You suddenly increase how long or hard you exercise or play a sport. You have injured your foot or ankle before. What are the signs or symptoms? Symptoms of this condition start soon after an injury and include: Pain, especially in the arch of your foot. Bruising. Swelling. Being unable to walk or use your foot to support body weight. How is this diagnosed? This condition is diagnosed with a medical history and physical exam. You may also have imaging tests, such as: X-rays to check for broken bones (fractures). An MRI to see if the ligament is torn. How is this treated? Treatment for this condition depends on the severity of the sprain. Mild sprains and major sprains can be treated with: Rest, ice, pressure (compression), and elevation (RICE). Elevation means raising your injured foot. Keeping your foot in a fixed position (immobilization) for a period of time. This is done if your ligament is overstretched or partially torn. Your health care provider will apply a bandage, splint, or walking boot to keep your foot from moving until it heals. Using crutches or a scooter for a few weeks to avoid bearing weight on your foot while it is healing. Physical therapy exercises to improve movement and strength in your foot. Major sprains may also be  treated with: Surgery. This is done if your ligament is fully torn and a procedure is needed to reconnect it to the bone. A cast or splint. This will be needed after surgery. A cast or splint will need to stay on your foot while it heals. Follow these instructions at home: If you have a bandage, splint, or boot: Wear it as told by your health care provider. Remove it only as told by your health care provider. Loosen it if your toes tingle, become numb, or turn cold and blue. Keep it clean and dry. If you have a cast: Do not put pressure on any part of the cast until it is fully hardened. This may take several hours. Do not stick anything inside the cast to scratch your skin. Doing that increases your risk for infection. Check the skin around the cast every day. Tell your health care provider about any concerns. You may put lotion on dry skin around the edges of the cast. Do not put lotion on the skin underneath the cast. Keep it clean and dry. Bathing Do not take baths, swim, or use a hot tub until your health care provider approves. Ask your health care provider if you may take showers. You may only be allowed to take sponge baths. If the bandage, splint, boot, or cast is not waterproof: Do not let it get wet. Cover it with a watertight covering when you take a bath or shower. Managing pain, stiffness, and swelling  If directed, put ice on the   injured area. To do this: If you have a removable bandage, splint, or boot, remove it as told by your health care provider. Put ice in a plastic bag. Place a towel between your skin and the bag, or between your cast and the bag. Leave the ice on for 20 minutes, 2-3 times per day. Remove the ice if your skin turns bright red. This is very important. If you cannot feel pain, heat, or cold, you have a greater risk of damage to the area. Move your toes often to reduce stiffness and swelling. Elevate the injured area above the level of your heart while  you are sitting or lying down. Activity Do not use the injured foot to support your body weight until your health care provider says that you can. Use crutches or a scooter as told by your health care provider. Ask your health care provider what activities are safe for you. Do exercises as told by your health care provider. Gradually increase how much and how far you walk until your health care provider says it is safe to return to full activity. Driving Ask your health care provider if the medicine prescribed to you requires you to avoid driving or using machinery. Ask your health care provider when it is safe to drive if you have a bandage, splint, boot, or cast on your foot. General instructions Take over-the-counter and prescription medicines only as told by your health care provider. When you can walk without pain, wear supportive shoes that have stiff soles. Do not wear flip-flops. Do not walk barefoot. Keep all follow-up visits. This is important. Contact a health care provider if: Medicine does not help your pain. Your bruising or swelling gets worse or does not get better with treatment. Your splint, boot, or cast is damaged. Get help right away if: You develop severe numbness or tingling in your foot. Your foot turns blue, white, or gray, and it feels cold. Summary A foot sprain is an injury to one of the ligaments in the feet. Ligaments are strong tissues that connect bones to each other. You may need a bandage, splint, boot, or cast to support your foot while it heals. Sometimes, surgery may be needed. You may need physical therapy exercises to improve movement and strength in your foot. This information is not intended to replace advice given to you by your health care provider. Make sure you discuss any questions you have with your health care provider. Document Revised: 09/24/2019 Document Reviewed: 09/24/2019 Elsevier Patient Education  2023 Else[employer] Inc[employer]  

## 2021-11-06 NOTE — Progress Notes (Signed)
Established Patient Office Visit  Subjective   Patient ID: Paige Waters, female    DOB: Sep 18, 1950  Age: 71 y.o. MRN: [id number]  Chief Complaint  Patient presents with   Ankle Pain   Toe Injury    Toe pain and swelling right foot since injury yesterday     HPI Pt here today c/o pain pain in R foot .  She inverted her foot and her 4th toe has been been black and blue and and swollen    Patient Active Problem List   Diagnosis Date Noted   Squamous cell carcinoma of leg 04/05/2019   Plantar fasciitis 04/09/2017   Right shoulder pain 12/04/2011   Preventative health care 10/23/2010   Hyperlipidemia 10/23/2010   MOLE 08/16/2008   LOW BACK PAIN, MILD 01/15/2008   OSTEOPENIA 05/21/2007   Symptomatic menopausal or female climacteric states 04/17/2007   THYROID NODULE, RIGHT 02/20/2007   CERVICAL LYMPHADENOPATHY, LEFT 02/17/2007   PRURITIC DISORDER NOS 01/22/2007   HAND PAIN, RIGHT 12/17/2006   Past Medical History:  Diagnosis Date   Colon polyps    Hyperlipidemia    Osteopenia    Thyroid nodule    Uterine hyperplasia    Past Surgical History:  Procedure Laterality Date   COLONOSCOPY  2007   LAPAROSCOPY     Social History   Tobacco Use   Smoking status: Former    Types: Cigarettes    Quit date: 06/17/1980    Years since quitting: 41.4   Smokeless tobacco: Never  Substance Use Topics   Alcohol use: Yes    Comment: occas   Drug use: No   Social History   Socioeconomic History   Marital status: Widowed    Spouse name: Not on file   Number of children: Not on file   Years of education: Not on file   Highest education level: Not on file  Occupational History   Occupation: [employer]  Tobacco Use   Smoking status: Former    Types: Cigarettes    Quit date: 06/17/1980    Years since quitting: 41.4   Smokeless tobacco: Never  Substance and Sexual Activity   Alcohol use: Yes    Comment: occas   Drug use: No   Sexual activity: Yes    Partners: Male  Other  Topics Concern   Not on file  Social History Narrative   Not on file   Social Determinants of Health   Financial Resource Strain: Low Risk    Difficulty of Paying Living Expenses: Not hard at all  Food Insecurity: No Food Insecurity   Worried About Charity fundraiser in the Last Year: Never true   [place] in the Last Year: Never true  Transportation Needs: No Transportation Needs   Lack of Transportation (Medical): No   Lack of Transportation (Non-Medical): No  Physical Activity: Sufficiently Active   Days of Exercise per Week: 4 days   Minutes of Exercise per Session: 40 min  Stress: No Stress Concern Present   Feeling of Stress : Not at all  Social Connections: Moderately Integrated   Frequency of Communication with Friends and Family: More than three times a week   Frequency of Social Gatherings with Friends and Family: More than three times a week   Attends Religious Services: More than 4 times per year   Active Member of [employer] or Organizations: Yes   Attends Archivist Meetings: More than 4 times per year   Marital  Status: Widowed  Human resources officer Violence: Not At Risk   Fear of Current or Ex-Partner: No   Emotionally Abused: No   Physically Abused: No   Sexually Abused: No   Family Status  Relation Name Status   Father Lynann Bologna Deceased   Mother ola Alive   Brother jack Alive   Brother kenneth Alive   Neg Hx  (Not Specified)   Family History  Problem Relation Age of Onset   Coronary artery disease Father 89   Heart disease Father 51       MI---2nd one at 73   Heart attack Father    Hyperlipidemia Father    Osteoporosis Mother    Colon polyps Brother    Kidney disease Brother        dialysis   Hyperlipidemia Brother    Diabetes Neg Hx    Hypertension Neg Hx    Sudden death Neg Hx    Colon cancer Neg Hx    No Known Allergies    Review of Systems  Constitutional:  Negative for fever and malaise/fatigue.  HENT:  Negative for  congestion.   Eyes:  Negative for blurred vision.  Respiratory:  Negative for cough and shortness of breath.   Cardiovascular:  Negative for chest pain, palpitations and leg swelling.  Gastrointestinal:  Negative for vomiting.  Musculoskeletal:  Positive for joint pain. Negative for back pain.  Skin:  Negative for rash.  Neurological:  Negative for loss of consciousness and headaches.     Objective:     BP (!) 129/48 (BP Location: Right Arm, Patient Position: Sitting, Cuff Size: Small)   Pulse 73   Temp 98.2 F (36.8 C) (Oral)   Resp 16   Wt 145 lb (65.8 kg)   LMP 06/17/2010 (Within Years)   SpO2 97%   BMI 24.13 kg/m  BP Readings from Last 3 Encounters:  11/06/21 (!) 129/48  04/26/21 102/80  04/21/20 106/80   Wt Readings from Last 3 Encounters:  11/06/21 145 lb (65.8 kg)  04/26/21 146 lb 6.4 oz (66.4 kg)  12/08/20 154 lb (69.9 kg)   SpO2 Readings from Last 3 Encounters:  11/06/21 97%  04/26/21 97%  04/21/20 98%      Physical Exam Vitals and nursing note reviewed.  Constitutional:      Appearance: She is well-developed.  HENT:     Head: Normocephalic and atraumatic.  Eyes:     Conjunctiva/sclera: Conjunctivae normal.  Neck:     Thyroid: No thyromegaly.     Vascular: No carotid bruit or JVD.  Cardiovascular:     Rate and Rhythm: Normal rate and regular rhythm.     Heart sounds: Normal heart sounds. No murmur heard. Pulmonary:     Effort: Pulmonary effort is normal. No respiratory distress.     Breath sounds: Normal breath sounds. No wheezing or rales.  Chest:     Chest wall: No tenderness.  Musculoskeletal:        General: Swelling, tenderness and signs of injury present.     Cervical back: Normal range of motion and neck supple.     Right foot: Normal capillary refill. Swelling, tenderness and bony tenderness present. No crepitus.     Left foot: Normal.     Comments: R foot --  4 th toe swollen and bruised Tenderness with palp of 5 th as well  Min pain  with weight bearing   Neurological:     Mental Status: She is alert and oriented to person,  place, and time.    No results found for any visits on 11/06/21.  Last CBC Lab Results  Component Value Date   WBC 4.2 04/26/2021   HGB 13.3 04/26/2021   HCT 40.4 04/26/2021   MCV 90.8 04/26/2021   MCH 31.0 04/21/2020   RDW 13.0 04/26/2021   PLT 180.0 88/82/8003   Last metabolic panel Lab Results  Component Value Date   GLUCOSE 94 04/26/2021   NA 139 04/26/2021   K 4.9 04/26/2021   CL 102 04/26/2021   CO2 31 04/26/2021   BUN 10 04/26/2021   CREATININE 0.67 04/26/2021   CALCIUM 9.5 04/26/2021   PROT 6.5 04/26/2021   ALBUMIN 4.2 04/26/2021   BILITOT 0.5 04/26/2021   ALKPHOS 83 04/26/2021   AST 15 04/26/2021   ALT 13 04/26/2021   Last lipids Lab Results  Component Value Date   CHOL 218 (H) 04/26/2021   HDL 57.00 04/26/2021   LDLCALC 146 (H) 04/26/2021   LDLDIRECT 141.2 05/06/2013   TRIG 72.0 04/26/2021   CHOLHDL 4 04/26/2021      The 10-year ASCVD risk score (Arnett DK, et al., 2019) is: 8.8%    Assessment & Plan:  1. Pain of toe of right foot Ace bandage and post op boot Check xray  Buddy tape toes  - DG Foot Complete Right; Future     Return if symptoms worsen or fail to improve.    Ann Held, DO

## 2021-12-10 ENCOUNTER — Ambulatory Visit (INDEPENDENT_AMBULATORY_CARE_PROVIDER_SITE_OTHER): Payer: Medicare Other

## 2021-12-10 VITALS — Ht 65.0 in | Wt 145.0 lb

## 2021-12-10 DIAGNOSIS — Z0000 Encounter for general adult medical examination without abnormal findings: Secondary | ICD-10-CM

## 2022-04-29 ENCOUNTER — Encounter: Payer: Medicare Other | Admitting: Family Medicine

## 2022-05-30 DIAGNOSIS — L111 Transient acantholytic dermatosis [Grover]: Secondary | ICD-10-CM | POA: Diagnosis not present

## 2022-05-30 DIAGNOSIS — D0359 Melanoma in situ of other part of trunk: Secondary | ICD-10-CM | POA: Diagnosis not present

## 2022-05-30 DIAGNOSIS — D1801 Hemangioma of skin and subcutaneous tissue: Secondary | ICD-10-CM | POA: Diagnosis not present

## 2022-05-30 DIAGNOSIS — D235 Other benign neoplasm of skin of trunk: Secondary | ICD-10-CM | POA: Diagnosis not present

## 2022-05-30 DIAGNOSIS — D224 Melanocytic nevi of scalp and neck: Secondary | ICD-10-CM | POA: Diagnosis not present

## 2022-05-30 DIAGNOSIS — L738 Other specified follicular disorders: Secondary | ICD-10-CM | POA: Diagnosis not present

## 2022-05-30 DIAGNOSIS — Z85828 Personal history of other malignant neoplasm of skin: Secondary | ICD-10-CM | POA: Diagnosis not present

## 2022-05-30 DIAGNOSIS — D2261 Melanocytic nevi of right upper limb, including shoulder: Secondary | ICD-10-CM | POA: Diagnosis not present

## 2022-05-30 DIAGNOSIS — L821 Other seborrheic keratosis: Secondary | ICD-10-CM | POA: Diagnosis not present

## 2022-06-04 ENCOUNTER — Encounter: Payer: Medicare Other | Admitting: Family Medicine

## 2022-06-19 DIAGNOSIS — D0359 Melanoma in situ of other part of trunk: Secondary | ICD-10-CM | POA: Diagnosis not present

## 2022-06-19 DIAGNOSIS — L988 Other specified disorders of the skin and subcutaneous tissue: Secondary | ICD-10-CM | POA: Diagnosis not present

## 2022-07-11 DIAGNOSIS — Z1231 Encounter for screening mammogram for malignant neoplasm of breast: Secondary | ICD-10-CM | POA: Diagnosis not present

## 2022-07-11 LAB — HM MAMMOGRAPHY

## 2022-11-26 DIAGNOSIS — H524 Presbyopia: Secondary | ICD-10-CM | POA: Diagnosis not present

## 2022-12-17 ENCOUNTER — Ambulatory Visit (INDEPENDENT_AMBULATORY_CARE_PROVIDER_SITE_OTHER): Payer: Medicare Other | Admitting: Family Medicine

## 2022-12-17 ENCOUNTER — Encounter: Payer: Self-pay | Admitting: Family Medicine

## 2022-12-17 VITALS — BP 110/70 | HR 62 | Temp 98.1°F | Resp 18 | Ht 65.0 in | Wt 148.0 lb

## 2022-12-17 DIAGNOSIS — E785 Hyperlipidemia, unspecified: Secondary | ICD-10-CM | POA: Diagnosis not present

## 2022-12-17 DIAGNOSIS — Z0000 Encounter for general adult medical examination without abnormal findings: Secondary | ICD-10-CM | POA: Diagnosis not present

## 2022-12-17 DIAGNOSIS — E2839 Other primary ovarian failure: Secondary | ICD-10-CM | POA: Diagnosis not present

## 2022-12-17 DIAGNOSIS — C439 Malignant melanoma of skin, unspecified: Secondary | ICD-10-CM | POA: Diagnosis not present

## 2022-12-17 NOTE — Progress Notes (Signed)
Established Patient Office Visit  Subjective   Patient ID: Paige Waters, female    DOB: [birth year]  Age: 72 y.o. MRN: [id number]  Chief Complaint  Patient presents with   Annual Exam    Pt states not fasting     HPI Discussed the use of AI scribe software for clinical note transcription with the patient, who gave verbal consent to proceed.  History of Present Illness   The patient, with a history of melanoma and squamous cell carcinoma, presents for a routine physical examination. They report no changes in their medical history or medication use. They mention a recent melanoma on their stomach, which was superficial and removed without further treatment. The patient has been vigilant about skin changes since then and has noticed a new spot on their leg, which they describe as 'odd looking' and 'uneven.' They plan to have this spot evaluated by their dermatologist.  The patient also discusses their exercise routine, which includes yoga and weight lifting. They have recently started yoga to improve their flexibility and balance, and continue to do weight lifting for upper body strength. They report no joint or stomach issues.      Patient Active Problem List   Diagnosis Date Noted   Melanoma of skin (HCC) 12/17/2022   Estrogen deficiency 12/17/2022   Preventative health care 12/17/2022   Squamous cell carcinoma of leg 04/05/2019   Plantar fasciitis 04/09/2017   Right shoulder pain 12/04/2011   Ischemic heart disease screen 10/23/2010   Hyperlipidemia 10/23/2010   MOLE 08/16/2008   LOW BACK PAIN, MILD 01/15/2008   OSTEOPENIA 05/21/2007   Symptomatic menopausal or female climacteric states 04/17/2007   THYROID NODULE, RIGHT 02/20/2007   CERVICAL LYMPHADENOPATHY, LEFT 02/17/2007   PRURITIC DISORDER NOS 01/22/2007   HAND PAIN, RIGHT 12/17/2006   Past Medical History:  Diagnosis Date   Colon polyps    Hyperlipidemia    Osteopenia    Thyroid nodule    Uterine hyperplasia     Past Surgical History:  Procedure Laterality Date   COLONOSCOPY  2007   LAPAROSCOPY     Social History   Tobacco Use   Smoking status: Former    Types: Cigarettes    Quit date: 06/17/1980    Years since quitting: 42.5   Smokeless tobacco: Never  Substance Use Topics   Alcohol use: Yes    Comment: occas   Drug use: No   Social History   Socioeconomic History   Marital status: Widowed    Spouse name: Not on file   Number of children: Not on file   Years of education: Not on file   Highest education level: Not on file  Occupational History   Occupation: [employer]  Tobacco Use   Smoking status: Former    Types: Cigarettes    Quit date: 06/17/1980    Years since quitting: 42.5   Smokeless tobacco: Never  Substance and Sexual Activity   Alcohol use: Yes    Comment: occas   Drug use: No   Sexual activity: Yes    Partners: Male  Other Topics Concern   Not on file  Social History Narrative   Not on file   Social Determinants of Health   Financial Resource Strain: Low Risk  (12/10/2021)   Overall Financial Resource Strain (CARDIA)    Difficulty of Paying Living Expenses: Not hard at all  Food Insecurity: No Food Insecurity (12/10/2021)   Hunger Vital Sign    Worried About Running Out  of Food in the Last Year: Never true    Ran Out of Food in the Last Year: Never true  Transportation Needs: No Transportation Needs (12/10/2021)   PRAPARE - Administrator, Civil Service (Medical): No    Lack of Transportation (Non-Medical): No  Physical Activity: Sufficiently Active (12/10/2021)   Exercise Vital Sign    Days of Exercise per Week: 7 days    Minutes of Exercise per Session: 30 min  Stress: No Stress Concern Present (12/10/2021)   [employer] of Occupational Health - Occupational Stress Questionnaire    Feeling of Stress : Not at all  Social Connections: Moderately Integrated (12/10/2021)   Social Connection and Isolation Panel [NHANES]    Frequency of  Communication with Friends and Family: More than three times a week    Frequency of Social Gatherings with Friends and Family: More than three times a week    Attends Religious Services: More than 4 times per year    Active Member of [employer] or Organizations: Yes    Attends Banker Meetings: More than 4 times per year    Marital Status: Widowed  Intimate Partner Violence: Not At Risk (12/10/2021)   Humiliation, Afraid, Rape, and Kick questionnaire    Fear of Current or Ex-Partner: No    Emotionally Abused: No    Physically Abused: No    Sexually Abused: No   Family Status  Relation Name Status   Father Lollie Sails Deceased   Mother ola Alive   Brother jack Alive   Brother kenneth Alive   Neg Hx  (Not Specified)   Family History  Problem Relation Age of Onset   Coronary artery disease Father 14   Heart disease Father 75       MI---2nd one at 63   Heart attack Father    Hyperlipidemia Father    Osteoporosis Mother    Colon polyps Brother    Kidney disease Brother        dialysis   Hyperlipidemia Brother    Diabetes Neg Hx    Hypertension Neg Hx    Sudden death Neg Hx    Colon cancer Neg Hx    No Known Allergies    Review of Systems  Constitutional:  Negative for fever and malaise/fatigue.  HENT:  Negative for congestion.   Eyes:  Negative for blurred vision.  Respiratory:  Negative for cough and shortness of breath.   Cardiovascular:  Negative for chest pain, palpitations and leg swelling.  Gastrointestinal:  Negative for abdominal pain, blood in stool, nausea and vomiting.  Genitourinary:  Negative for dysuria and frequency.  Musculoskeletal:  Negative for back pain and falls.  Skin:  Negative for rash.  Neurological:  Negative for dizziness, loss of consciousness and headaches.  Endo/Heme/Allergies:  Negative for environmental allergies.  Psychiatric/Behavioral:  Negative for depression. The patient is not nervous/anxious.       Objective:     BP 110/70  (BP Location: Left Arm, Patient Position: Sitting, Cuff Size: Normal)   Pulse 62   Temp 98.1 F (36.7 C) (Oral)   Resp 18   Ht 5\' 5"  (1.651 m)   Wt 148 lb (67.1 kg)   LMP 06/17/2010 (Within Years)   SpO2 97%   BMI 24.63 kg/m  BP Readings from Last 3 Encounters:  12/17/22 110/70  11/06/21 (!) 129/48  04/26/21 102/80   Wt Readings from Last 3 Encounters:  12/17/22 148 lb (67.1 kg)  12/10/21 145 lb (65.8  kg)  11/06/21 145 lb (65.8 kg)   SpO2 Readings from Last 3 Encounters:  12/17/22 97%  11/06/21 97%  04/26/21 97%      Physical Exam Vitals and nursing note reviewed.  Constitutional:      General: She is not in acute distress.    Appearance: Normal appearance. She is well-developed.  HENT:     Head: Normocephalic and atraumatic.     Right Ear: Tympanic membrane, ear canal and external ear normal. There is no impacted cerumen.     Left Ear: Tympanic membrane, ear canal and external ear normal. There is no impacted cerumen.     Nose: Nose normal.     Mouth/Throat:     Mouth: Mucous membranes are moist.     Pharynx: Oropharynx is clear. No oropharyngeal exudate or posterior oropharyngeal erythema.  Eyes:     General: No scleral icterus.       Right eye: No discharge.        Left eye: No discharge.     Conjunctiva/sclera: Conjunctivae normal.     Pupils: Pupils are equal, round, and reactive to light.  Neck:     Thyroid: No thyromegaly or thyroid tenderness.     Vascular: No JVD.  Cardiovascular:     Rate and Rhythm: Normal rate and regular rhythm.     Heart sounds: Normal heart sounds. No murmur heard. Pulmonary:     Effort: Pulmonary effort is normal. No respiratory distress.     Breath sounds: Normal breath sounds.  Abdominal:     General: Bowel sounds are normal. There is no distension.     Palpations: Abdomen is soft. There is no mass.     Tenderness: There is no abdominal tenderness. There is no guarding or rebound.  Genitourinary:    Vagina: Normal.   Musculoskeletal:        General: Normal range of motion.     Cervical back: Normal range of motion and neck supple.     Right lower leg: No edema.     Left lower leg: No edema.  Lymphadenopathy:     Cervical: No cervical adenopathy.  Skin:    General: Skin is warm and dry.     Findings: No erythema or rash.  Neurological:     Mental Status: She is alert and oriented to person, place, and time.     Cranial Nerves: No cranial nerve deficit.     Deep Tendon Reflexes: Reflexes are normal and symmetric.  Psychiatric:        Mood and Affect: Mood normal.        Behavior: Behavior normal.        Thought Content: Thought content normal.        Judgment: Judgment normal.      No results found for any visits on 12/17/22.                                 Last CBC Lab Results  Component Value Date   WBC 4.2 04/26/2021   HGB 13.3 04/26/2021   HCT 40.4 04/26/2021   MCV 90.8 04/26/2021   MCH 31.0 04/21/2020   RDW 13.0 04/26/2021   PLT 180.0 04/26/2021   Last metabolic panel Lab Results  Component Value Date   GLUCOSE 94 04/26/2021   NA 139 04/26/2021   K 4.9 04/26/2021   CL 102 04/26/2021   CO2 31 04/26/2021   BUN  10 04/26/2021   CREATININE 0.67 04/26/2021   GFR 89.53 04/26/2021   CALCIUM 9.5 04/26/2021   PROT 6.5 04/26/2021   ALBUMIN 4.2 04/26/2021   BILITOT 0.5 04/26/2021   ALKPHOS 83 04/26/2021   AST 15 04/26/2021   ALT 13 04/26/2021   Last lipids Lab Results  Component Value Date   CHOL 218 (H) 04/26/2021   HDL 57.00 04/26/2021   LDLCALC 146 (H) 04/26/2021   LDLDIRECT 141.2 05/06/2013   TRIG 72.0 04/26/2021   CHOLHDL 4 04/26/2021   Last hemoglobin A1c No results found for: "HGBA1C" Last thyroid functions Lab Results  Component Value Date   TSH 3.36 04/19/2019   Last vitamin D No results found for: "25OHVITD2", "25OHVITD3", "VD25OH" Last vitamin B12 and Folate No results found for: "VITAMINB12", "FOLATE"    The 10-year  ASCVD risk score (Arnett DK, et al., 2019) is: 7.3%    Assessment & Plan:   Problem List Items Addressed This Visit       Unprioritized   Preventative health care - Primary    Ghm utd Check labs  See AVS  Health Maintenance  Topic Date Due   Medicare Annual Wellness (AWV)  12/11/2022   COVID-19 Vaccine (2 - Janssen risk series) 01/02/2023 (Originally 09/24/2019)   Pneumonia Vaccine 19+ Years old (1 of 1 - PCV) 12/17/2023 (Originally 06/16/2017)   INFLUENZA VACCINE  01/16/2023   DEXA SCAN  06/26/2023   MAMMOGRAM  07/12/2023   Colonoscopy  07/02/2026   DTaP/Tdap/Td (3 - Td or Tdap) 04/16/2028   Hepatitis C Screening  Completed   Zoster Vaccines- Shingrix  Completed   HPV VACCINES  Aged Out        Melanoma of skin (HCC)    Con't f/u dematology      Hyperlipidemia    Encourage heart healthy diet such as MIND or DASH diet, increase exercise, avoid trans fats, simple carbohydrates and processed foods, consider a krill or fish or flaxseed oil cap daily.        Relevant Orders   CBC with Differential/Platelet   Comprehensive metabolic panel   Lipid panel   TSH   Estrogen deficiency  Assessment and Plan    History of Melanoma: Superficial melanoma removed from the stomach in December. No further treatment required. Annual dermatology follow-ups established. New skin lesion on the leg noted. -Refer to dermatology for evaluation of the new skin lesion on the leg.  General Health Maintenance: Up to date with most vaccinations, but declined Pneumonia vaccine. Received single-dose J&J COVID-19 vaccine and completed Shingles vaccination. -Continue to offer Pneumonia vaccine annually. -Return for routine physical next year.  Follow-up: Return for lab work on a future date as patient had eaten prior to today's visit.        Return in about 1 year (around 12/17/2023) for annual exam, fasting.    Donato Schultz, DO

## 2022-12-17 NOTE — Assessment & Plan Note (Signed)
Encourage heart healthy diet such as MIND or DASH diet, increase exercise, avoid trans fats, simple carbohydrates and processed foods, consider a krill or fish or flaxseed oil cap daily.  °

## 2022-12-17 NOTE — Patient Instructions (Signed)
Preventive Care 65 Years and Older, Female Preventive care refers to lifestyle choices and visits with your health care provider that can promote health and wellness. Preventive care visits are also called wellness exams. What can I expect for my preventive care visit? Counseling Your health care provider may ask you questions about your: Medical history, including: Past medical problems. Family medical history. Pregnancy and menstrual history. History of falls. Current health, including: Memory and ability to understand (cognition). Emotional well-being. Home life and relationship well-being. Sexual activity and sexual health. Lifestyle, including: Alcohol, nicotine or tobacco, and drug use. Access to firearms. Diet, exercise, and sleep habits. Work and work environment. Sunscreen use. Safety issues such as seatbelt and bike helmet use. Physical exam Your health care provider will check your: Height and weight. These may be used to calculate your BMI (body mass index). BMI is a measurement that tells if you are at a healthy weight. Waist circumference. This measures the distance around your waistline. This measurement also tells if you are at a healthy weight and may help predict your risk of certain diseases, such as type 2 diabetes and high blood pressure. Heart rate and blood pressure. Body temperature. Skin for abnormal spots. What immunizations do I need?  Vaccines are usually given at various ages, according to a schedule. Your health care provider will recommend vaccines for you based on your age, medical history, and lifestyle or other factors, such as travel or where you work. What tests do I need? Screening Your health care provider may recommend screening tests for certain conditions. This may include: Lipid and cholesterol levels. Hepatitis C test. Hepatitis B test. HIV (human immunodeficiency virus) test. STI (sexually transmitted infection) testing, if you are at  risk. Lung cancer screening. Colorectal cancer screening. Diabetes screening. This is done by checking your blood sugar (glucose) after you have not eaten for a while (fasting). Mammogram. Talk with your health care provider about how often you should have regular mammograms. BRCA-related cancer screening. This may be done if you have a family history of breast, ovarian, tubal, or peritoneal cancers. Bone density scan. This is done to screen for osteoporosis. Talk with your health care provider about your test results, treatment options, and if necessary, the need for more tests. Follow these instructions at home: Eating and drinking  Eat a diet that includes fresh fruits and vegetables, whole grains, lean protein, and low-fat dairy products. Limit your intake of foods with high amounts of sugar, saturated fats, and salt. Take vitamin and mineral supplements as recommended by your health care provider. Do not drink alcohol if your health care provider tells you not to drink. If you drink alcohol: Limit how much you have to 0-1 drink a day. Know how much alcohol is in your drink. In the [place] one drink equals one 12 oz bottle of beer (355 mL), one 5 oz glass of wine (148 mL), or one 1 oz glass of hard liquor (44 mL). Lifestyle Brush your teeth every morning and night with fluoride toothpaste. Floss one time each day. Exercise for at least 30 minutes 5 or more days each week. Do not use any products that contain nicotine or tobacco. These products include cigarettes, chewing tobacco, and vaping devices, such as e-cigarettes. If you need help quitting, ask your health care provider. Do not use drugs. If you are sexually active, practice safe sex. Use a condom or other form of protection in order to prevent STIs. Take aspirin only as told by   your health care provider. Make sure that you understand how much to take and what form to take. Work with your health care provider to find out whether it  is safe and beneficial for you to take aspirin daily. Ask your health care provider if you need to take a cholesterol-lowering medicine (statin). Find healthy ways to manage stress, such as: Meditation, yoga, or listening to music. Journaling. Talking to a trusted person. Spending time with friends and family. Minimize exposure to UV radiation to reduce your risk of skin cancer. Safety Always wear your seat belt while driving or riding in a vehicle. Do not drive: If you have been drinking alcohol. Do not ride with someone who has been drinking. When you are tired or distracted. While texting. If you have been using any mind-altering substances or drugs. Wear a helmet and other protective equipment during sports activities. If you have firearms in your house, make sure you follow all gun safety procedures. What's next? Visit your health care provider once a year for an annual wellness visit. Ask your health care provider how often you should have your eyes and teeth checked. Stay up to date on all vaccines. This information is not intended to replace advice given to you by your health care provider. Make sure you discuss any questions you have with your health care provider. Document Revised: 11/29/2020 Document Reviewed: 11/29/2020 Elsevier Patient Education  2024 [employer] Inc.  

## 2022-12-17 NOTE — Assessment & Plan Note (Signed)
Ghm utd  Check labs  Health Maintenance  Topic Date Due   Medicare Annual Wellness (AWV)  12/11/2022   COVID-19 Vaccine (2 - Janssen risk series) 01/02/2023 (Originally 09/24/2019)   Pneumonia Vaccine 54+ Years old (1 of 1 - PCV) 12/17/2023 (Originally 06/16/2017)   INFLUENZA VACCINE  01/16/2023   DEXA SCAN  06/26/2023   MAMMOGRAM  07/12/2023   Colonoscopy  07/02/2026   DTaP/Tdap/Td (3 - Td or Tdap) 04/16/2028   Hepatitis C Screening  Completed   Zoster Vaccines- Shingrix  Completed   HPV VACCINES  Aged Out

## 2022-12-17 NOTE — Assessment & Plan Note (Signed)
Con't f/u dematology

## 2022-12-17 NOTE — Assessment & Plan Note (Signed)
Ghm utd Check labs  See AVS  Health Maintenance  Topic Date Due   Medicare Annual Wellness (AWV)  12/11/2022   COVID-19 Vaccine (2 - Janssen risk series) 01/02/2023 (Originally 09/24/2019)   Pneumonia Vaccine 18+ Years old (1 of 1 - PCV) 12/17/2023 (Originally 06/16/2017)   INFLUENZA VACCINE  01/16/2023   DEXA SCAN  06/26/2023   MAMMOGRAM  07/12/2023   Colonoscopy  07/02/2026   DTaP/Tdap/Td (3 - Td or Tdap) 04/16/2028   Hepatitis C Screening  Completed   Zoster Vaccines- Shingrix  Completed   HPV VACCINES  Aged Out

## 2022-12-18 ENCOUNTER — Other Ambulatory Visit (INDEPENDENT_AMBULATORY_CARE_PROVIDER_SITE_OTHER): Payer: Medicare Other

## 2022-12-18 DIAGNOSIS — E785 Hyperlipidemia, unspecified: Secondary | ICD-10-CM

## 2022-12-18 LAB — LIPID PANEL
Cholesterol: 225 mg/dL — ABNORMAL HIGH (ref 0–200)
HDL: 63.1 mg/dL (ref >39.00)
LDL Cholesterol: 144 mg/dL — ABNORMAL HIGH (ref 0–99)
NonHDL: 161.47
Total CHOL/HDL Ratio: 4
Triglycerides: 87 mg/dL (ref 0.0–149.0)
VLDL: 17.4 mg/dL (ref 0.0–40.0)

## 2022-12-18 LAB — CBC WITH DIFFERENTIAL/PLATELET
Basophils Absolute: 0 10*3/uL (ref 0.0–0.1)
Basophils Relative: 1.1 % (ref 0.0–3.0)
Eosinophils Absolute: 0.1 10*3/uL (ref 0.0–0.7)
Eosinophils Relative: 2.4 % (ref 0.0–5.0)
HCT: 41.8 % (ref 36.0–46.0)
Hemoglobin: 13.8 g/dL (ref 12.0–15.0)
Lymphocytes Relative: 40.2 % (ref 12.0–46.0)
Lymphs Abs: 1.6 10*3/uL (ref 0.7–4.0)
MCHC: 33.1 g/dL (ref 30.0–36.0)
MCV: 91.7 fl (ref 78.0–100.0)
Monocytes Absolute: 0.3 10*3/uL (ref 0.1–1.0)
Monocytes Relative: 7.8 % (ref 3.0–12.0)
Neutro Abs: 1.9 10*3/uL (ref 1.4–7.7)
Neutrophils Relative %: 48.5 % (ref 43.0–77.0)
Platelets: 192 10*3/uL (ref 150.0–400.0)
RBC: 4.56 Mil/uL (ref 3.87–5.11)
RDW: 13.8 % (ref 11.5–15.5)
WBC: 3.9 10*3/uL — ABNORMAL LOW (ref 4.0–10.5)

## 2022-12-18 LAB — COMPREHENSIVE METABOLIC PANEL
ALT: 11 U/L (ref 0–35)
AST: 13 U/L (ref 0–37)
Albumin: 4 g/dL (ref 3.5–5.2)
Alkaline Phosphatase: 74 U/L (ref 39–117)
BUN: 8 mg/dL (ref 6–23)
CO2: 30 mEq/L (ref 19–32)
Calcium: 9.5 mg/dL (ref 8.4–10.5)
Chloride: 102 mEq/L (ref 96–112)
Creatinine, Ser: 0.69 mg/dL (ref 0.40–1.20)
GFR: 87.88 mL/min (ref >60.00)
Glucose, Bld: 85 mg/dL (ref 70–99)
Potassium: 4.1 mEq/L (ref 3.5–5.1)
Sodium: 137 mEq/L (ref 135–145)
Total Bilirubin: 0.5 mg/dL (ref 0.2–1.2)
Total Protein: 6.3 g/dL (ref 6.0–8.3)

## 2022-12-18 LAB — TSH: TSH: 4.56 u[IU]/mL (ref 0.35–5.50)

## 2023-01-08 DIAGNOSIS — L820 Inflamed seborrheic keratosis: Secondary | ICD-10-CM | POA: Diagnosis not present

## 2023-06-03 DIAGNOSIS — D1722 Benign lipomatous neoplasm of skin and subcutaneous tissue of left arm: Secondary | ICD-10-CM | POA: Diagnosis not present

## 2023-06-03 DIAGNOSIS — Z85828 Personal history of other malignant neoplasm of skin: Secondary | ICD-10-CM | POA: Diagnosis not present

## 2023-06-03 DIAGNOSIS — L570 Actinic keratosis: Secondary | ICD-10-CM | POA: Diagnosis not present

## 2023-06-03 DIAGNOSIS — L821 Other seborrheic keratosis: Secondary | ICD-10-CM | POA: Diagnosis not present

## 2023-06-03 DIAGNOSIS — D1801 Hemangioma of skin and subcutaneous tissue: Secondary | ICD-10-CM | POA: Diagnosis not present

## 2023-06-03 DIAGNOSIS — Z85820 Personal history of malignant melanoma of skin: Secondary | ICD-10-CM | POA: Diagnosis not present

## 2023-06-03 DIAGNOSIS — L111 Transient acantholytic dermatosis [Grover]: Secondary | ICD-10-CM | POA: Diagnosis not present

## 2023-07-17 DIAGNOSIS — Z1231 Encounter for screening mammogram for malignant neoplasm of breast: Secondary | ICD-10-CM | POA: Diagnosis not present

## 2023-07-17 LAB — HM MAMMOGRAPHY

## 2023-07-18 ENCOUNTER — Encounter: Payer: Self-pay | Admitting: Family Medicine

## 2023-12-22 ENCOUNTER — Encounter: Payer: Self-pay | Admitting: Family Medicine

## 2023-12-22 ENCOUNTER — Ambulatory Visit (INDEPENDENT_AMBULATORY_CARE_PROVIDER_SITE_OTHER): Payer: Medicare Other | Admitting: Family Medicine

## 2023-12-22 VITALS — BP 100/80 | HR 58 | Temp 97.7°F | Resp 16 | Ht 65.0 in | Wt 142.4 lb

## 2023-12-22 DIAGNOSIS — Z0000 Encounter for general adult medical examination without abnormal findings: Secondary | ICD-10-CM | POA: Diagnosis not present

## 2023-12-22 DIAGNOSIS — E785 Hyperlipidemia, unspecified: Secondary | ICD-10-CM

## 2023-12-22 LAB — LIPID PANEL
Cholesterol: 206 mg/dL — ABNORMAL HIGH (ref 0–200)
HDL: 63.1 mg/dL (ref >39.00)
LDL Cholesterol: 127 mg/dL — ABNORMAL HIGH (ref 0–99)
NonHDL: 142.88
Total CHOL/HDL Ratio: 3
Triglycerides: 79 mg/dL (ref 0.0–149.0)
VLDL: 15.8 mg/dL (ref 0.0–40.0)

## 2023-12-22 LAB — TSH: TSH: 4.6 u[IU]/mL (ref 0.35–5.50)

## 2023-12-22 LAB — COMPREHENSIVE METABOLIC PANEL WITH GFR
ALT: 11 U/L (ref 0–35)
AST: 14 U/L (ref 0–37)
Albumin: 4.3 g/dL (ref 3.5–5.2)
Alkaline Phosphatase: 68 U/L (ref 39–117)
BUN: 10 mg/dL (ref 6–23)
CO2: 28 meq/L (ref 19–32)
Calcium: 9.4 mg/dL (ref 8.4–10.5)
Chloride: 104 meq/L (ref 96–112)
Creatinine, Ser: 0.62 mg/dL (ref 0.40–1.20)
GFR: 89.54 mL/min (ref >60.00)
Glucose, Bld: 89 mg/dL (ref 70–99)
Potassium: 4.8 meq/L (ref 3.5–5.1)
Sodium: 138 meq/L (ref 135–145)
Total Bilirubin: 0.4 mg/dL (ref 0.2–1.2)
Total Protein: 6.4 g/dL (ref 6.0–8.3)

## 2023-12-22 LAB — CBC WITH DIFFERENTIAL/PLATELET
Basophils Absolute: 0 K/uL (ref 0.0–0.1)
Basophils Relative: 1 % (ref 0.0–3.0)
Eosinophils Absolute: 0.2 K/uL (ref 0.0–0.7)
Eosinophils Relative: 5 % (ref 0.0–5.0)
HCT: 36.2 % (ref 36.0–46.0)
Hemoglobin: 12 g/dL (ref 12.0–15.0)
Lymphocytes Relative: 29.6 % (ref 12.0–46.0)
Lymphs Abs: 1.2 K/uL (ref 0.7–4.0)
MCHC: 33.1 g/dL (ref 30.0–36.0)
MCV: 88.4 fl (ref 78.0–100.0)
Monocytes Absolute: 0.3 K/uL (ref 0.1–1.0)
Monocytes Relative: 6.4 % (ref 3.0–12.0)
Neutro Abs: 2.3 K/uL (ref 1.4–7.7)
Neutrophils Relative %: 58 % (ref 43.0–77.0)
Platelets: 186 K/uL (ref 150.0–400.0)
RBC: 4.1 Mil/uL (ref 3.87–5.11)
RDW: 14.6 % (ref 11.5–15.5)
WBC: 3.9 K/uL — ABNORMAL LOW (ref 4.0–10.5)

## 2023-12-22 NOTE — Assessment & Plan Note (Signed)
 Ghm utd Check labs  See AVS Health Maintenance  Topic Date Due   Medicare Annual Wellness (AWV)  12/11/2022   DEXA SCAN  06/26/2023   COVID-19 Vaccine (2 - Janssen risk series) 01/07/2024 (Originally 09/24/2019)   Pneumococcal Vaccine: 50+ Years (1 of 1 - PCV) 12/21/2024 (Originally 06/16/2002)   INFLUENZA VACCINE  01/16/2024   MAMMOGRAM  07/16/2024   Colonoscopy  07/02/2026   DTaP/Tdap/Td (3 - Td or Tdap) 04/16/2028   Hepatitis C Screening  Completed   Zoster Vaccines- Shingrix   Completed   Hepatitis B Vaccines  Aged Out   HPV VACCINES  Aged Out   Meningococcal B Vaccine  Aged Out

## 2023-12-22 NOTE — Patient Instructions (Signed)
 Preventive Care 43 Years and Older, Female Preventive care refers to lifestyle choices and visits with your health care provider that can promote health and wellness. Preventive care visits are also called wellness exams. What can I expect for my preventive care visit? Counseling Your health care provider may ask you questions about your: Medical history, including: Past medical problems. Family medical history. Pregnancy and menstrual history. History of falls. Current health, including: Memory and ability to understand (cognition). Emotional well-being. Home life and relationship well-being. Sexual activity and sexual health. Lifestyle, including: Alcohol, nicotine or tobacco, and drug use. Access to firearms. Diet, exercise, and sleep habits. Work and work Astronomer. Sunscreen use. Safety issues such as seatbelt and bike helmet use. Physical exam Your health care provider will check your: Height and weight. These may be used to calculate your BMI (body mass index). BMI is a measurement that tells if you are at a healthy weight. Waist circumference. This measures the distance around your waistline. This measurement also tells if you are at a healthy weight and may help predict your risk of certain diseases, such as type 2 diabetes and high blood pressure. Heart rate and blood pressure. Body temperature. Skin for abnormal spots. What immunizations do I need?  Vaccines are usually given at various ages, according to a schedule. Your health care provider will recommend vaccines for you based on your age, medical history, and lifestyle or other factors, such as travel or where you work. What tests do I need? Screening Your health care provider may recommend screening tests for certain conditions. This may include: Lipid and cholesterol levels. Hepatitis C test. Hepatitis B test. HIV (human immunodeficiency virus) test. STI (sexually transmitted infection) testing, if you are at  risk. Lung cancer screening. Colorectal cancer screening. Diabetes screening. This is done by checking your blood sugar (glucose) after you have not eaten for a while (fasting). Mammogram. Talk with your health care provider about how often you should have regular mammograms. BRCA-related cancer screening. This may be done if you have a family history of breast, ovarian, tubal, or peritoneal cancers. Bone density scan. This is done to screen for osteoporosis. Talk with your health care provider about your test results, treatment options, and if necessary, the need for more tests. Follow these instructions at home: Eating and drinking  Eat a diet that includes fresh fruits and vegetables, whole grains, lean protein, and low-fat dairy products. Limit your intake of foods with high amounts of sugar, saturated fats, and salt. Take vitamin and mineral supplements as recommended by your health care provider. Do not drink alcohol if your health care provider tells you not to drink. If you drink alcohol: Limit how much you have to 0-1 drink a day. Know how much alcohol is in your drink. In the [place] one drink equals one 12 oz bottle of beer (355 mL), one 5 oz glass of wine (148 mL), or one 1 oz glass of hard liquor (44 mL). Lifestyle Brush your teeth every morning and night with fluoride toothpaste. Floss one time each day. Exercise for at least 30 minutes 5 or more days each week. Do not use any products that contain nicotine or tobacco. These products include cigarettes, chewing tobacco, and vaping devices, such as e-cigarettes. If you need help quitting, ask your health care provider. Do not use drugs. If you are sexually active, practice safe sex. Use a condom or other form of protection in order to prevent STIs. Take aspirin only as told by  your health care provider. Make sure that you understand how much to take and what form to take. Work with your health care provider to find out whether it  is safe and beneficial for you to take aspirin daily. Ask your health care provider if you need to take a cholesterol-lowering medicine (statin). Find healthy ways to manage stress, such as: Meditation, yoga, or listening to music. Journaling. Talking to a trusted person. Spending time with friends and family. Minimize exposure to UV radiation to reduce your risk of skin cancer. Safety Always wear your seat belt while driving or riding in a vehicle. Do not drive: If you have been drinking alcohol. Do not ride with someone who has been drinking. When you are tired or distracted. While texting. If you have been using any mind-altering substances or drugs. Wear a helmet and other protective equipment during sports activities. If you have firearms in your house, make sure you follow all gun safety procedures. What's next? Visit your health care provider once a year for an annual wellness visit. Ask your health care provider how often you should have your eyes and teeth checked. Stay up to date on all vaccines. This information is not intended to replace advice given to you by your health care provider. Make sure you discuss any questions you have with your health care provider. Document Revised: 11/29/2020 Document Reviewed: 11/29/2020 Elsevier Patient Education  2024 [employer].

## 2023-12-22 NOTE — Assessment & Plan Note (Signed)
 Encourage heart healthy diet such as MIND or DASH diet, increase exercise, avoid trans fats, simple carbohydrates and processed foods, consider a krill or fish or flaxseed oil cap daily.

## 2023-12-22 NOTE — Progress Notes (Signed)
 Established Patient Office Visit  Subjective   Patient ID: Paige Waters, female    DOB: 1950-08-13  Age: 73 y.o. MRN: [id number]  Chief Complaint  Patient presents with   Annual Exam    Pt states fasting     HPI Discussed the use of AI scribe software for clinical note transcription with the patient, who gave verbal consent to proceed.  History of Present Illness Paige Waters is a 73 year old female who presents for an annual physical exam.  She has no current complaints and is not taking any medications at this time.  She had a mammogram in January and is due for a bone density test this year. She has declined the pneumonia and COVID vaccines. Her colonoscopy is not due until 2028. She recently had a gut biome test done through a physical therapist, which showed favorable results.  Her past medical history includes melanoma, with no new occurrences. She plans to have it checked again this year and continues to see her dermatologist.  In terms of her social history, she is retired and engages in regular physical activity, including weight training twice a week, daily walking, yoga, and tai chi classes at her church. She has been participating in tai chi for about three to four months, which she finds beneficial for balance. She does not smoke.   Patient Active Problem List   Diagnosis Date Noted   Melanoma of skin (HCC) 12/17/2022   Estrogen deficiency 12/17/2022   Preventative health care 12/17/2022   Squamous cell carcinoma of leg 04/05/2019   Plantar fasciitis 04/09/2017   Right shoulder pain 12/04/2011   Ischemic heart disease screen 10/23/2010   Hyperlipidemia 10/23/2010   Benign neoplasm of skin 08/16/2008   LOW BACK PAIN, MILD 01/15/2008   Disorder of bone and cartilage 05/21/2007   Symptomatic menopausal or female climacteric states 04/17/2007   THYROID  NODULE, RIGHT 02/20/2007   Enlarged lymph nodes 02/17/2007   Pruritic disorder 01/22/2007   HAND PAIN, RIGHT  12/17/2006   Past Medical History:  Diagnosis Date   Colon polyps    Hyperlipidemia    Osteopenia    Thyroid  nodule    Uterine hyperplasia    Past Surgical History:  Procedure Laterality Date   COLONOSCOPY  2007   LAPAROSCOPY     Social History   Tobacco Use   Smoking status: Former    Current packs/day: 0.00    Types: Cigarettes    Quit date: 06/17/1980    Years since quitting: 43.5   Smokeless tobacco: Never  Substance Use Topics   Alcohol use: Yes    Comment: occas   Drug use: No   Social History   Socioeconomic History   Marital status: Widowed    Spouse name: Not on file   Number of children: Not on file   Years of education: Not on file   Highest education level: Not on file  Occupational History   Occupation: [employer]    Comment: retired  Tobacco Use   Smoking status: Former    Current packs/day: 0.00    Types: Cigarettes    Quit date: 06/17/1980    Years since quitting: 43.5   Smokeless tobacco: Never  Substance and Sexual Activity   Alcohol use: Yes    Comment: occas   Drug use: No   Sexual activity: Yes    Partners: Male  Other Topics Concern   Not on file  Social History Narrative   Exercising --  weights 2x a week, daily walking, yoga, tai chi   Social Drivers of Health   Financial Resource Strain: Low Risk  (12/10/2021)   Overall Financial Resource Strain (CARDIA)    Difficulty of Paying Living Expenses: Not hard at all  Food Insecurity: No Food Insecurity (12/10/2021)   Hunger Vital Sign    Worried About Running Out of Food in the Last Year: Never true    Ran Out of Food in the Last Year: Never true  Transportation Needs: No Transportation Needs (12/10/2021)   PRAPARE - Administrator, Civil Service (Medical): No    Lack of Transportation (Non-Medical): No  Physical Activity: Sufficiently Active (12/10/2021)   Exercise Vital Sign    Days of Exercise per Week: 7 days    Minutes of Exercise per Session: 30 min  Stress: No  Stress Concern Present (12/10/2021)   [employer] of Occupational Health - Occupational Stress Questionnaire    Feeling of Stress : Not at all  Social Connections: Moderately Integrated (12/10/2021)   Social Connection and Isolation Panel    Frequency of Communication with Friends and Family: More than three times a week    Frequency of Social Gatherings with Friends and Family: More than three times a week    Attends Religious Services: More than 4 times per year    Active Member of [employer] or Organizations: Yes    Attends Banker Meetings: More than 4 times per year    Marital Status: Widowed  Intimate Partner Violence: Not At Risk (12/10/2021)   Humiliation, Afraid, Rape, and Kick questionnaire    Fear of Current or Ex-Partner: No    Emotionally Abused: No    Physically Abused: No    Sexually Abused: No   Family Status  Relation Name Status   Father miquel Deceased   Mother ola Alive   Brother jack Alive   Brother kenneth Alive   Neg Hx  (Not Specified)  No partnership data on file   Family History  Problem Relation Age of Onset   Coronary artery disease Father 65   Heart disease Father 31       MI---2nd one at 30   Heart attack Father    Hyperlipidemia Father    Osteoporosis Mother    Colon polyps Brother    Kidney disease Brother        dialysis   Hyperlipidemia Brother    Diabetes Neg Hx    Hypertension Neg Hx    Sudden death Neg Hx    Colon cancer Neg Hx    No Known Allergies    Review of Systems  Constitutional:  Negative for chills, fever and malaise/fatigue.  HENT:  Negative for congestion and hearing loss.   Eyes:  Negative for blurred vision and discharge.  Respiratory:  Negative for cough, sputum production and shortness of breath.   Cardiovascular:  Negative for chest pain, palpitations and leg swelling.  Gastrointestinal:  Negative for abdominal pain, blood in stool, constipation, diarrhea, heartburn, nausea and vomiting.   Genitourinary:  Negative for dysuria, frequency, hematuria and urgency.  Musculoskeletal:  Negative for back pain, falls and myalgias.  Skin:  Negative for rash.  Neurological:  Negative for dizziness, sensory change, loss of consciousness, weakness and headaches.  Endo/Heme/Allergies:  Negative for environmental allergies. Does not bruise/bleed easily.  Psychiatric/Behavioral:  Negative for depression and suicidal ideas. The patient is not nervous/anxious and does not have insomnia.       Objective:  BP 100/80 (BP Location: Left Arm, Patient Position: Sitting, Cuff Size: Normal)   Pulse (!) 58   Temp 97.7 F (36.5 C) (Oral)   Resp 16   Ht 5' 5 (1.651 m)   Wt 142 lb 6.4 oz (64.6 kg)   LMP 06/17/2010 (Within Years)   SpO2 99%   BMI 23.70 kg/m  BP Readings from Last 3 Encounters:  12/22/23 100/80  12/17/22 110/70  11/06/21 (!) 129/48   Wt Readings from Last 3 Encounters:  12/22/23 142 lb 6.4 oz (64.6 kg)  12/17/22 148 lb (67.1 kg)  12/10/21 145 lb (65.8 kg)   SpO2 Readings from Last 3 Encounters:  12/22/23 99%  12/17/22 97%  11/06/21 97%      Physical Exam Vitals and nursing note reviewed.  Constitutional:      General: She is not in acute distress.    Appearance: Normal appearance. She is well-developed.  HENT:     Head: Normocephalic and atraumatic.     Right Ear: Tympanic membrane, ear canal and external ear normal. There is no impacted cerumen.     Left Ear: Tympanic membrane, ear canal and external ear normal. There is no impacted cerumen.     Nose: Nose normal.     Mouth/Throat:     Mouth: Mucous membranes are moist.     Pharynx: Oropharynx is clear. No oropharyngeal exudate or posterior oropharyngeal erythema.  Eyes:     General: No scleral icterus.       Right eye: No discharge.        Left eye: No discharge.     Conjunctiva/sclera: Conjunctivae normal.     Pupils: Pupils are equal, round, and reactive to light.  Neck:     Thyroid : No  thyromegaly or thyroid  tenderness.     Vascular: No JVD.  Cardiovascular:     Rate and Rhythm: Normal rate and regular rhythm.     Heart sounds: Normal heart sounds. No murmur heard. Pulmonary:     Effort: Pulmonary effort is normal. No respiratory distress.     Breath sounds: Normal breath sounds.  Abdominal:     General: Bowel sounds are normal. There is no distension.     Palpations: Abdomen is soft. There is no mass.     Tenderness: There is no abdominal tenderness. There is no guarding or rebound.  Musculoskeletal:        General: Normal range of motion.     Cervical back: Normal range of motion and neck supple.     Right lower leg: No edema.     Left lower leg: No edema.  Lymphadenopathy:     Cervical: No cervical adenopathy.  Skin:    General: Skin is warm and dry.     Findings: No erythema or rash.  Neurological:     Mental Status: She is alert and oriented to person, place, and time.     Cranial Nerves: No cranial nerve deficit.     Deep Tendon Reflexes: Reflexes are normal and symmetric.  Psychiatric:        Mood and Affect: Mood normal.        Behavior: Behavior normal.        Thought Content: Thought content normal.        Judgment: Judgment normal.      No results found for any visits on 12/22/23.  Last CBC Lab Results  Component Value Date   WBC 3.9 (L) 12/18/2022   HGB 13.8 12/18/2022   HCT  41.8 12/18/2022   MCV 91.7 12/18/2022   MCH 31.0 04/21/2020   RDW 13.8 12/18/2022   PLT 192.0 12/18/2022   Last metabolic panel Lab Results  Component Value Date   GLUCOSE 85 12/18/2022   NA 137 12/18/2022   K 4.1 12/18/2022   CL 102 12/18/2022   CO2 30 12/18/2022   BUN 8 12/18/2022   CREATININE 0.69 12/18/2022   GFR 87.88 12/18/2022   CALCIUM 9.5 12/18/2022   PROT 6.3 12/18/2022   ALBUMIN 4.0 12/18/2022   BILITOT 0.5 12/18/2022   ALKPHOS 74 12/18/2022   AST 13 12/18/2022   ALT 11 12/18/2022   Last lipids Lab Results  Component Value Date   CHOL  225 (H) 12/18/2022   HDL 63.10 12/18/2022   LDLCALC 144 (H) 12/18/2022   LDLDIRECT 141.2 05/06/2013   TRIG 87.0 12/18/2022   CHOLHDL 4 12/18/2022   Last hemoglobin A1c No results found for: HGBA1C Last thyroid  functions Lab Results  Component Value Date   TSH 4.56 12/18/2022   Last vitamin D  No results found for: 25OHVITD2, 25OHVITD3, VD25OH Last vitamin B12 and Folate No results found for: VITAMINB12, FOLATE    The 10-year ASCVD risk score (Arnett DK, et al., 2019) is: 6.7%    Assessment & Plan:   Problem List Items Addressed This Visit       Unprioritized   Preventative health care - Primary   Ghm utd Check labs  See AVS Health Maintenance  Topic Date Due   Medicare Annual Wellness (AWV)  12/11/2022   DEXA SCAN  06/26/2023   COVID-19 Vaccine (2 - Janssen risk series) 01/07/2024 (Originally 09/24/2019)   Pneumococcal Vaccine: 50+ Years (1 of 1 - PCV) 12/21/2024 (Originally 06/16/2002)   INFLUENZA VACCINE  01/16/2024   MAMMOGRAM  07/16/2024   Colonoscopy  07/02/2026   DTaP/Tdap/Td (3 - Td or Tdap) 04/16/2028   Hepatitis C Screening  Completed   Zoster Vaccines- Shingrix   Completed   Hepatitis B Vaccines  Aged Out   HPV VACCINES  Aged Out   Meningococcal B Vaccine  Aged Out         Relevant Orders   CBC with Differential/Platelet   Comprehensive metabolic panel with GFR   Lipid panel   TSH   Hyperlipidemia   Encourage heart healthy diet such as MIND or DASH diet, increase exercise, avoid trans fats, simple carbohydrates and processed foods, consider a krill or fish or flaxseed oil cap daily.        Relevant Orders   CBC with Differential/Platelet   Comprehensive metabolic panel with GFR   Lipid panel   TSH  Assessment and Plan Assessment & Plan General Health Maintenance   At 18, she maintains good health through regular exercise, including walking, weight training, yoga, and tai chi. She is retired, takes no medications, and has a history  of melanoma with no recurrence. Her mammogram is current as of January, and a bone density test is due this year. She has declined pneumonia and COVID vaccines. Her colonoscopy is scheduled for 2028, and a recent GI map showed good results. She regularly visits a dermatologist and needs an eye checkup. Order a bone density test with the next mammogram. Respect her decision to decline vaccines. Schedule routine blood work, including lipid panel, glucose, liver, renal, and thyroid  function tests. Encourage continuation of her exercise regimen and schedule an eye checkup.    Return in about 1 year (around 12/21/2024), or if symptoms worsen or fail to improve,  for annual exam, fasting.    Zaiah Credeur R Lowne Chase, DO

## 2023-12-23 ENCOUNTER — Ambulatory Visit: Payer: Self-pay | Admitting: Family Medicine
